# Patient Record
Sex: Female | Born: 1980
Health system: Southern US, Community
[De-identification: ages and names within clinical notes are randomized; demographics above are authoritative.]

## PROBLEM LIST (undated history)

## (undated) DIAGNOSIS — G43909 Migraine, unspecified, not intractable, without status migrainosus: Secondary | ICD-10-CM

## (undated) HISTORY — DX: Migraine, unspecified, not intractable, without status migrainosus: G43.909

## (undated) HISTORY — PX: WISDOM TOOTH EXTRACTION: SHX21

## (undated) HISTORY — PX: NO PAST SURGERIES: SHX2092

---

## 2008-09-16 ENCOUNTER — Ambulatory Visit: Payer: Self-pay | Admitting: Obstetrics and Gynecology

## 2009-07-20 ENCOUNTER — Ambulatory Visit: Payer: Self-pay | Admitting: Obstetrics & Gynecology

## 2010-04-13 ENCOUNTER — Ambulatory Visit: Payer: Self-pay | Admitting: Obstetrics and Gynecology

## 2010-04-13 LAB — CONVERTED CEMR LAB
Trich, Wet Prep: NONE SEEN
Yeast Wet Prep HPF POC: NONE SEEN

## 2010-04-14 ENCOUNTER — Encounter: Payer: Self-pay | Admitting: Obstetrics and Gynecology

## 2010-04-14 LAB — CONVERTED CEMR LAB
Chlamydia, DNA Probe: NEGATIVE
GC Probe Amp, Genital: NEGATIVE

## 2010-05-25 ENCOUNTER — Ambulatory Visit: Payer: Self-pay | Admitting: Obstetrics and Gynecology

## 2010-08-02 ENCOUNTER — Ambulatory Visit: Payer: Self-pay | Admitting: Obstetrics and Gynecology

## 2010-08-03 ENCOUNTER — Encounter (INDEPENDENT_AMBULATORY_CARE_PROVIDER_SITE_OTHER): Payer: Self-pay | Admitting: *Deleted

## 2011-01-09 ENCOUNTER — Other Ambulatory Visit: Payer: Self-pay

## 2011-03-20 NOTE — Assessment & Plan Note (Signed)
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  She was recently on a trip for 2 weeks to  [place] and when she arrived back, she started having some urinary  symptoms over the weekend that appeared as burning when she urinated and  urinary frequency and feeling still needing an urge to go after she  finished urinating.  She was placed for 3 days on Cipro and then 2 days  after the onset of the symptoms, she began having tender swollen lymph  nodes in the inguinal area.  No external lesions were noticed by the  patient nor abnormal discharge.   On examination; her abdomen is soft, flat, nontender with no masses or  organomegaly.  The inguinal areas bilaterally showed enlarged lymph  nodes, somewhat tender to the touch, but certainly not exquisitely  tender, larger on the right side than on the left, but definitely  enlarged.  The external genitalia was normal.  BUS was within normal  limits.  No discharge from Skene glands nor the Bartholin glands.  No  significant abnormal discharge noted in the vagina which was well  rugated.  The cervix is clean, nulliparous.  GC Chlamydia, wet prep, and  viral cultures were taken.  The uterus is anterior, small, easily  mobile, free cul-de-sac and of normal size, shape, consistency with  normal adnexa.   The impression is normal pelvic examination with tender inguinal glands  and dysuria with urinary frequency and a feeling of not emptying.  I am  not sure exactly what is causing this patient's symptoms of any urine  culture and sensitivity just on the rare chance that she has  a bacteria  that was not sensitive to the Cipro.  She was started on some Pyridium  until that result comes back being that urinary tract infections rarely  cause lymphadenopathy and since there is no sign of any active PID or  any other infection, I am not quite sure what the etiology of the  increased lymph nodes are, although more likely than not it is a viral  in etiology and will resolve spontaneously.           ______________________________  Argentina Donovan, MD     PR/MEDQ  D:  04/13/2010  T:  04/13/2010  Job:  [id number]

## 2011-03-20 NOTE — Assessment & Plan Note (Signed)
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  She  is married.  Her husband has had a vasectomy.  She thinks that he has  had an outbreak in the past and she has never had an outbreak since that  time.  Following her visit to [place] last time, she said she had numbers of  the feet and buttocks, but that all has resolved and all her symptoms  have resolved, so we discussed the possibility of HSV suppression and  whether it was worthwhile for her to do.  My feeling is probably even  though her husband probably has had outbreak in the past that it would  be a good idea, so I would suggest that she did, however, I told her  that it is with her and her husband to decide whether or not and she  felt it was worthwhile for them.  She is doing research it further on  the internet and make a decision.  Otherwise, she is asymptomatic.  She  does take oral contraceptives but that is more to control facial acne,  vulgaris and birth control at this point.  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           ______________________________  Argentina Donovan, MD     PR/MEDQ  D:  05/25/2010  T:  05/25/2010  Job:  [id number]

## 2011-03-20 NOTE — Group Therapy Note (Signed)
NAMEDENISS, WORMLEY NO.:  [id number]   MEDICAL RECORD NO.:  [id number]          PATIENT TYPE:  WOC   LOCATION[facility]  [facility]                   FACILITY:  [facility]   PHYSICIAN:  Argentina Donovan, MD        DATE OF BIRTH:  1981/07/20   DATE OF SERVICE:  09/16/2008                                  CLINIC NOTE   REASON FOR VISIT:  [name] is as 30 year old female who  presents for evaluation of low back pain after falling from  approximately 2-1/2 feet yesterday.  She states she was on a ladder  painting and slipped off the ladder, fell and landed directly on her  buttocks on a can of paint.  She relates pain, most specifically to the  tail bone area.  However, she also relates some pain with flexion and  going from a seated to a standing position.  She denies any loss of  bowel or bladder function.  She denies any loss of strength of her lower  extremities.  She denies any decreased sensation in her lower  extremities.   PAST MEDICAL HISTORY:  None.   PAST SURGICAL HISTORY:  None.   No known drug allergies.   MEDICATIONS:  Are oral contraceptives.   SOCIAL HISTORY:  She is an occasional smoker and an occasional alcoholic  beverage.   REVIEW OF SYSTEMS:  Is as per history of present illness.   PHYSICAL EXAM:  Her blood pressure today is 140/77, heart rate is 73,  temperature is 98.2 weight is 128.7 pounds.  On physical exam she has a normal gait and posture.  She has a decreased  range of motion with active forward flexion.  Extension of the low  lumbar spine is normal.  She has tenderness to palpation on both SI  joints with the right greater than left.  However, she is most markedly  tender to palpation over the sacrum at the coccyx.  She has normal 5/5  strength of her quadriceps, hamstrings, hip flexors as well as dorsi and  plantar flexion of both feet.  She has normal sensation of both lower  extremities and her DTRs are 2/4 and equal bilaterally.   Straight leg  raise on the left side produced painful SI joint.  There is no radiation  down the leg with a straight leg raise.  She has negative straight leg  raise on her right side.   ASSESSMENT/PLAN:  1. Acute lumbar strain.  2. Probable coccyx fracture.  Most likely the patient's back pain and      tenderness are related to acute lumbar strain secondary to her      fall.  Given her mechanism of injury as well as the amount of pain      she has in the area of the coccyx, this is highly suspicious for a      coccyx fracture.  I had a lengthy discussion with the patient      regarding the fact that there is not much intervention that is      helpful for a coccyx fracture  other than tincture of time and      symptomatic treatment.  An x-ray was ordered of her lumbar sacral      spine as well as coccyx.  The patient relates that she has      ibuprofen at home.  I offered her a muscle relaxant.  She declined.      I recommended that she use a heating pad or warm bath and showers      for symptomatic relief as well in addition to some mild stretching      as well as some mild physical activities as tolerated.  She will      follow up as needed or if her back pain is not improving over the      next 4-6 weeks.     ______________________________  Jeanne Lynch, D.O.    ______________________________  Argentina Donovan, MD    MC/MEDQ  D:  09/16/2008  T:  09/16/2008  Job:  [id number]

## 2011-11-16 ENCOUNTER — Other Ambulatory Visit: Payer: Self-pay | Admitting: Obstetrics & Gynecology

## 2011-11-16 MED ORDER — NORGESTIMATE-ETH ESTRADIOL 0.25-35 MG-MCG PO TABS: 1.0000 | ORAL_TABLET | Freq: Every day | ORAL | Status: DC

## 2012-03-07 ENCOUNTER — Other Ambulatory Visit: Payer: Self-pay | Admitting: Obstetrics & Gynecology

## 2012-03-07 DIAGNOSIS — N63 Unspecified lump in unspecified breast: Secondary | ICD-10-CM

## 2012-03-14 ENCOUNTER — Ambulatory Visit
Admission: RE | Admit: 2012-03-14 | Discharge: 2012-03-14 | Disposition: A | Discharge status: Home / Self Care | Payer: 59 | Source: Ambulatory Visit | Attending: Obstetrics & Gynecology | Admitting: Obstetrics & Gynecology

## 2012-03-14 ENCOUNTER — Other Ambulatory Visit: Payer: Self-pay | Admitting: Obstetrics & Gynecology

## 2012-03-14 DIAGNOSIS — N63 Unspecified lump in unspecified breast: Secondary | ICD-10-CM

## 2014-09-20 ENCOUNTER — Ambulatory Visit (INDEPENDENT_AMBULATORY_CARE_PROVIDER_SITE_OTHER): Payer: 59 | Admitting: General Practice

## 2014-09-20 DIAGNOSIS — Z23 Encounter for immunization: Secondary | ICD-10-CM

## 2015-11-07 ENCOUNTER — Other Ambulatory Visit: Payer: Self-pay | Admitting: Obstetrics & Gynecology

## 2015-11-07 MED ORDER — AMOXICILLIN 500 MG PO CAPS: 500.0000 mg | ORAL_CAPSULE | Freq: Three times a day (TID) | ORAL | Status: DC

## 2015-11-07 NOTE — Progress Notes (Signed)
Sinus pressure and congestion for 5 weeks not getting better with OTC remedies and saline irrigation.    Amoxicillin 500 tid for 10 days

## 2015-11-09 ENCOUNTER — Other Ambulatory Visit: Payer: Self-pay | Admitting: Obstetrics & Gynecology

## 2015-11-09 MED ORDER — AMOXICILLIN-POT CLAVULANATE 875-125 MG PO TABS: 1.0000 | ORAL_TABLET | Freq: Two times a day (BID) | ORAL | Status: DC

## 2015-11-09 NOTE — Progress Notes (Signed)
Not responding to amox.  Will change to augmentin  +FH 7 weeks 4 days

## 2015-11-19 ENCOUNTER — Other Ambulatory Visit: Payer: Self-pay | Admitting: Obstetrics & Gynecology

## 2015-11-19 MED ORDER — BUDESONIDE 32 MCG/ACT NA SUSP: 1.0000 | Freq: Every day | NASAL | Status: DC

## 2015-11-21 ENCOUNTER — Ambulatory Visit (INDEPENDENT_AMBULATORY_CARE_PROVIDER_SITE_OTHER): Payer: Commercial Managed Care - HMO | Admitting: Obstetrics and Gynecology

## 2015-11-21 ENCOUNTER — Encounter: Payer: Self-pay | Admitting: Obstetrics and Gynecology

## 2015-11-21 VITALS — BP 116/66 | HR 68 | Temp 98.5°F | Ht 70.0 in | Wt 150.6 lb

## 2015-11-21 DIAGNOSIS — J019 Acute sinusitis, unspecified: Secondary | ICD-10-CM | POA: Insufficient documentation

## 2015-11-21 NOTE — Progress Notes (Signed)
CLINIC ENCOUNTER NOTE  History:  35 y.o. G1P0 @ approximately 9.5 weeks here for sick visit.   Symptoms began 5-6 weeks ago. Initially sneezing and sinus pressure and stuffy nose. Getting progressively worse. No chest pain or trouble breathing. Bilateral paranasal discomfort. No nasal discharge. No fevers or chills. Tried amoxicillin, then augmentin. Has used nasal saline. And an OTC decongestant. About to start an intranasal steroid. Rash with sudafed. Never happened before. Does have history sinus infection with pus drainage. Hx seasonal allergies. Started augment, stopped, and now taking for one week. Nose itches.  Past Medical History  Diagnosis Date  . Migraines     History reviewed. No pertinent past surgical history.  The following portions of the patient's history were reviewed and updated as appropriate: allergies, current medications, past family history, past medical history, past social history, past surgical history and problem list.   Review of Systems:  See above; comprehensive review of systems was otherwise negative.  Objective:  Physical Exam BP 116/66 mmHg  Pulse 68  Temp(Src) 98.5 F (36.9 C)  Ht 5\' 10"  (1.778 m)  Wt 150 lb 9.6 oz (68.312 kg)  BMI 21.61 kg/m2 CONSTITUTIONAL: Well-developed, well-nourished female in no acute distress.  HENT:  Normocephalic, atraumatic. Mild ttp paranasally. No rhinorrhea. SKIN: Skin is warm and dry.  NEUROLGIC: Alert  PSYCHIATRIC: Normal mood and affect.  CARDIOVASCULAR: Normal heart rate noted RESPIRATORY: Effort and breath sounds normal, no problems with respiration noted   Labs and Imaging No results found.  Assessment & Plan:   # Acute rhinosinusitis - Given duration of symptoms agree with decision to trial augmentin; unfortunately does not appear to be working, but have told patient she should finish the prescription - non-toxic appearing: no fever, no significant pain, no purulent discharge - given well appearance  think prudent to treat empirically for allergic etiology, as patient's symptoms support this possible diagnosis (duration of symptoms, sneezing, itching, absence of constitutional symptoms). Thus agree with starting intranasal corticosteroid - if no improvement or clinical worsening over the next 1-2 weeks, would pursue head CT to evaluate for anatomic blockage, which sometimes requires surgery  Routine preventative health maintenance measures emphasized.     Moody Robben B. Kya Mayfield, MD OB/GYN Fellow Center [facility], [facility]

## 2015-11-25 ENCOUNTER — Encounter: Payer: Self-pay | Admitting: *Deleted

## 2015-11-25 ENCOUNTER — Ambulatory Visit (INDEPENDENT_AMBULATORY_CARE_PROVIDER_SITE_OTHER): Payer: Commercial Managed Care - HMO | Admitting: Family

## 2015-11-25 ENCOUNTER — Encounter: Payer: Self-pay | Admitting: Family

## 2015-11-25 VITALS — BP 127/79 | HR 75 | Wt 151.0 lb

## 2015-11-25 DIAGNOSIS — Z113 Encounter for screening for infections with a predominantly sexual mode of transmission: Secondary | ICD-10-CM | POA: Diagnosis not present

## 2015-11-25 DIAGNOSIS — Z1151 Encounter for screening for human papillomavirus (HPV): Secondary | ICD-10-CM

## 2015-11-25 DIAGNOSIS — Z3490 Encounter for supervision of normal pregnancy, unspecified, unspecified trimester: Secondary | ICD-10-CM | POA: Insufficient documentation

## 2015-11-25 DIAGNOSIS — Z3491 Encounter for supervision of normal pregnancy, unspecified, first trimester: Secondary | ICD-10-CM

## 2015-11-25 DIAGNOSIS — Z3401 Encounter for supervision of normal first pregnancy, first trimester: Secondary | ICD-10-CM

## 2015-11-25 DIAGNOSIS — Z3481 Encounter for supervision of other normal pregnancy, first trimester: Secondary | ICD-10-CM

## 2015-11-25 DIAGNOSIS — Z124 Encounter for screening for malignant neoplasm of cervix: Secondary | ICD-10-CM

## 2015-11-25 NOTE — Progress Notes (Signed)
      Subjective:    Jeanne Lynch is a G1P0 [redacted]w[redacted]d being seen today for her first obstetrical visit.  This is patient's first pregnancy.  Medical history insignificant for problems that may contribute to pregnancy.  Patient does intend to breast feed. Pregnancy history fully reviewed.  Patient reports no complaints.  Reports feeling "great"!  Filed Vitals:   11/25/15 1002  BP: 127/79  Pulse: 75  Weight: 151 lb (68.493 kg)    HISTORY: OB History  Gravida Para Term Preterm AB SAB TAB Ectopic Multiple Living  1             # Outcome Date GA Lbr Len/2nd Weight Sex Delivery Anes PTL Lv  1 Current              Past Medical History  Diagnosis Date  . Migraines    No past surgical history on file. History reviewed. No pertinent family history.   Exam    BP 127/79 mmHg  Pulse 75  Wt 151 lb (68.493 kg)  LMP 09/12/2015 (Approximate) Uterine Size: size equals dates  Pelvic Exam:    Perineum: No Hemorrhoids, Normal Perineum   Vulva: normal   Vagina:  normal mucosa, normal discharge, no palpable nodules   pH: Not done   Cervix: no bleeding following Pap, no cervical motion tenderness and no lesions   Adnexa: normal adnexa and no mass, fullness, tenderness   Bony Pelvis: Adequate  System: Breast:  No nipple retraction or dimpling, No nipple discharge or bleeding, No axillary or supraclavicular adenopathy, Normal to palpation without dominant masses   Skin: normal coloration and turgor, no rashes    Neurologic: negative   Extremities: normal strength, tone, and muscle mass   HEENT neck supple with midline trachea and thyroid without masses   Mouth/Teeth mucous membranes moist, pharynx normal without lesions   Neck supple and no masses   Cardiovascular: regular rate and rhythm, no murmurs or gallops   Respiratory:  appears well, vitals normal, no respiratory distress, acyanotic, normal RR, neck free of mass or lymphadenopathy, chest clear, no wheezing, crepitations,  rhonchi, normal symmetric air entry   Abdomen: soft, non-tender; bowel sounds normal; no masses,  no organomegaly   Urinary: urethral meatus normal       Assessment:    Pregnancy: G1P0 Patient Active Problem List   Diagnosis Date Noted  . Supervision of normal pregnancy, antepartum 11/25/2015  . Acute rhinosinusitis 11/21/2015        Plan:     Initial labs drawn.  Pap smear collected. Prenatal vitamins. Problem list reviewed and updated. Genetic Screening discussed First Screen: ordered.  Considering NIPS, checking with insurance regarding out-of-pocket cost.  Follow up in 4 weeks.  Desires to sign up for BabyScripts.  Marlis Edelson 11/25/2015

## 2015-11-25 NOTE — Progress Notes (Signed)
Bedside U/S shows IUP with FHT of 182 bpm and CRL 31.54mm  GA is 10w

## 2015-11-26 LAB — CULTURE, URINE COMPREHENSIVE
Colony Count: NO GROWTH
Organism ID, Bacteria: NO GROWTH

## 2015-11-26 LAB — HIV ANTIBODY (ROUTINE TESTING W REFLEX): HIV: NONREACTIVE

## 2015-11-28 LAB — OBSTETRIC PANEL
Antibody Screen: NEGATIVE
BASOS ABS: 0 10*3/uL (ref 0.0–0.1)
Basophils Relative: 0 % (ref 0–1)
Eosinophils Absolute: 0.3 10*3/uL (ref 0.0–0.7)
Eosinophils Relative: 4 % (ref 0–5)
HCT: 39.1 % (ref 36.0–46.0)
Hemoglobin: 13.2 g/dL (ref 12.0–15.0)
Hepatitis B Surface Ag: NEGATIVE
LYMPHS ABS: 1.8 10*3/uL (ref 0.7–4.0)
LYMPHS PCT: 22 % (ref 12–46)
MCH: 29.5 pg (ref 26.0–34.0)
MCHC: 33.8 g/dL (ref 30.0–36.0)
MCV: 87.3 fL (ref 78.0–100.0)
MONOS PCT: 5 % (ref 3–12)
MPV: 10.2 fL (ref 8.6–12.4)
Monocytes Absolute: 0.4 10*3/uL (ref 0.1–1.0)
NEUTROS PCT: 69 % (ref 43–77)
Neutro Abs: 5.6 10*3/uL (ref 1.7–7.7)
PLATELETS: 249 10*3/uL (ref 150–400)
RBC: 4.48 MIL/uL (ref 3.87–5.11)
RDW: 13 % (ref 11.5–15.5)
RUBELLA: 7.83 {index} — AB (ref <0.90)
Rh Type: POSITIVE
WBC: 8.1 10*3/uL (ref 4.0–10.5)

## 2015-11-28 LAB — CYTOLOGY - PAP

## 2015-12-09 ENCOUNTER — Ambulatory Visit (INDEPENDENT_AMBULATORY_CARE_PROVIDER_SITE_OTHER): Payer: Commercial Managed Care - HMO | Admitting: Family

## 2015-12-09 VITALS — BP 127/79 | HR 90 | Wt 150.0 lb

## 2015-12-09 DIAGNOSIS — Z23 Encounter for immunization: Secondary | ICD-10-CM

## 2015-12-09 DIAGNOSIS — O98319 Other infections with a predominantly sexual mode of transmission complicating pregnancy, unspecified trimester: Secondary | ICD-10-CM

## 2015-12-09 DIAGNOSIS — A6009 Herpesviral infection of other urogenital tract: Secondary | ICD-10-CM | POA: Insufficient documentation

## 2015-12-09 DIAGNOSIS — A609 Anogenital herpesviral infection, unspecified: Secondary | ICD-10-CM

## 2015-12-09 DIAGNOSIS — Z3401 Encounter for supervision of normal first pregnancy, first trimester: Secondary | ICD-10-CM

## 2015-12-09 DIAGNOSIS — Z3481 Encounter for supervision of other normal pregnancy, first trimester: Secondary | ICD-10-CM

## 2015-12-09 DIAGNOSIS — O98519 Other viral diseases complicating pregnancy, unspecified trimester: Secondary | ICD-10-CM

## 2015-12-09 MED ORDER — ACYCLOVIR 400 MG PO TABS: ORAL_TABLET | ORAL | Status: DC

## 2015-12-09 NOTE — Progress Notes (Signed)
Subjective:  Jeanne Lynch is a 35 y.o. G1P0 at [redacted]w[redacted]d being seen today for ongoing prenatal care.  She is currently monitored for the following issues for this low-risk pregnancy and has Acute rhinosinusitis; Supervision of normal pregnancy, antepartum; and Genital herpes affecting pregnancy on her problem list.  Patient reports no complaints.  Desires acyclovir for potential HSV outbreak  .  Marland Kitchen  Movement: Absent. Denies leaking of fluid.   The following portions of the patient's history were reviewed and updated as appropriate: allergies, current medications, past family history, past medical history, past social history, past surgical history and problem list. Problem list updated.  Objective:   Filed Vitals:   12/09/15 1028  BP: 127/79  Pulse: 90  Weight: 150 lb (68.04 kg)    Fetal Status:     Movement: Absent     General:  Alert, oriented and cooperative. Patient is in no acute distress.  Skin: Skin is warm and dry. No rash noted.   Cardiovascular: Normal heart rate noted  Respiratory: Normal respiratory effort, no problems with respiration noted  Abdomen: Soft, gravid, appropriate for gestational age. Pain/Pressure: Absent     Pelvic:       Cervical exam deferred        Extremities: Normal range of motion.  Edema: None  Mental Status: Normal mood and affect. Normal behavior. Normal judgment and thought content.   Urinalysis:    Protein negative Glucose negative  Assessment and Plan:  Pregnancy: G1P0 at [redacted]w[redacted]d  1. Supervision of normal pregnancy, antepartum, first trimester - [place] MFM OB COMP + 14 WK; Future - Discussed when movement is typically felt 18-20 wks  2. Genital herpes affecting pregnancy - Given RX for acyclovir - Explained will need treatment in 3rd trimester  General obstetric precautions including but not limited to vaginal bleeding and pelvic pain reviewed in detail with the patient. Please refer to After Visit Summary for other counseling recommendations.   Return for BabyScripts.   Eino Farber Kennith Gain, CNM

## 2015-12-15 ENCOUNTER — Ambulatory Visit (HOSPITAL_COMMUNITY): Payer: Commercial Managed Care - HMO

## 2015-12-15 ENCOUNTER — Other Ambulatory Visit (HOSPITAL_COMMUNITY): Payer: Commercial Managed Care - HMO

## 2015-12-19 ENCOUNTER — Encounter: Payer: Self-pay | Admitting: *Deleted

## 2015-12-19 DIAGNOSIS — Z3482 Encounter for supervision of other normal pregnancy, second trimester: Secondary | ICD-10-CM

## 2016-01-27 ENCOUNTER — Ambulatory Visit (HOSPITAL_COMMUNITY)
Admission: RE | Admit: 2016-01-27 | Discharge: 2016-01-27 | Disposition: A | Discharge status: Home / Self Care | Payer: Commercial Managed Care - HMO | Source: Ambulatory Visit | Attending: Family | Admitting: Family

## 2016-01-27 DIAGNOSIS — Z36 Encounter for antenatal screening of mother: Secondary | ICD-10-CM | POA: Insufficient documentation

## 2016-01-27 DIAGNOSIS — Z3A18 18 weeks gestation of pregnancy: Secondary | ICD-10-CM | POA: Insufficient documentation

## 2016-01-27 DIAGNOSIS — O4440 Low lying placenta NOS or without hemorrhage, unspecified trimester: Secondary | ICD-10-CM

## 2016-01-27 DIAGNOSIS — Z3481 Encounter for supervision of other normal pregnancy, first trimester: Secondary | ICD-10-CM

## 2016-01-30 DIAGNOSIS — O4440 Low lying placenta NOS or without hemorrhage, unspecified trimester: Secondary | ICD-10-CM | POA: Insufficient documentation

## 2016-02-03 ENCOUNTER — Ambulatory Visit (INDEPENDENT_AMBULATORY_CARE_PROVIDER_SITE_OTHER): Payer: Commercial Managed Care - HMO | Admitting: Family

## 2016-02-03 VITALS — BP 111/70 | HR 74 | Wt 151.0 lb

## 2016-02-03 DIAGNOSIS — Z3482 Encounter for supervision of other normal pregnancy, second trimester: Secondary | ICD-10-CM

## 2016-02-03 DIAGNOSIS — O4440 Low lying placenta NOS or without hemorrhage, unspecified trimester: Secondary | ICD-10-CM

## 2016-02-03 NOTE — Progress Notes (Signed)
Subjective:  Jeanne Lynch is a 35 y.o. G1P0 at 6572w6d being seen today for ongoing prenatal care.  She is currently monitored for the following issues for this low-risk pregnancy and has Acute rhinosinusitis; Supervision of normal pregnancy, antepartum; Genital herpes affecting pregnancy; and Low lying placenta, antepartum on her problem list.  Patient reports no complaints.   . Vag. Bleeding: None.  Movement: Absent. Denies leaking of fluid.   The following portions of the patient's history were reviewed and updated as appropriate: allergies, current medications, past family history, past medical history, past social history, past surgical history and problem list. Problem list updated.  Objective:   Filed Vitals:   02/03/16 1018  BP: 111/70  Pulse: 74  Weight: 151 lb (68.493 kg)    Fetal Status: Fetal Heart Rate (bpm): 136 Fundal Height: 20 cm Movement: Absent     General:  Alert, oriented and cooperative. Patient is in no acute distress.  Skin: Skin is warm and dry. No rash noted.   Cardiovascular: Normal heart rate noted  Respiratory: Normal respiratory effort, no problems with respiration noted  Abdomen: Soft, gravid, appropriate for gestational age. Pain/Pressure: Absent     Pelvic: Vag. Bleeding: None Vag D/C Character: Thin   Cervical exam deferred        Extremities: Normal range of motion.  Edema: None  Mental Status: Normal mood and affect. Normal behavior. Normal judgment and thought content.   Urinalysis: Urine Protein: Negative Urine Glucose: Trace  Assessment and Plan:  Pregnancy: G1P0 at 7672w6d  1. Supervision of normal pregnancy, antepartum, second trimester - Reviewed ultrasound results - nml.    2. Low lying placenta, antepartum - Continue bleeding precautions  General obstetric precautions including but not limited to vaginal bleeding and pelvic pain reviewed in detail with the patient. Please refer to After Visit Summary for other counseling  recommendations.  Return for BabyScripts.   Eino FarberWalidah Kennith GainN Karim, CNM

## 2016-03-09 ENCOUNTER — Other Ambulatory Visit: Payer: Self-pay | Admitting: Obstetrics & Gynecology

## 2016-03-09 DIAGNOSIS — O4440 Low lying placenta NOS or without hemorrhage, unspecified trimester: Secondary | ICD-10-CM

## 2016-03-30 ENCOUNTER — Ambulatory Visit (INDEPENDENT_AMBULATORY_CARE_PROVIDER_SITE_OTHER): Payer: Commercial Managed Care - HMO | Admitting: Certified Nurse Midwife

## 2016-03-30 VITALS — BP 117/66 | HR 78 | Wt 159.0 lb

## 2016-03-30 DIAGNOSIS — Z23 Encounter for immunization: Secondary | ICD-10-CM | POA: Diagnosis not present

## 2016-03-30 DIAGNOSIS — Z3482 Encounter for supervision of other normal pregnancy, second trimester: Secondary | ICD-10-CM

## 2016-03-30 DIAGNOSIS — J302 Other seasonal allergic rhinitis: Secondary | ICD-10-CM

## 2016-03-30 DIAGNOSIS — O4440 Low lying placenta NOS or without hemorrhage, unspecified trimester: Secondary | ICD-10-CM

## 2016-03-30 DIAGNOSIS — Z3492 Encounter for supervision of normal pregnancy, unspecified, second trimester: Secondary | ICD-10-CM

## 2016-03-30 LAB — CBC
HCT: 34.4 % — ABNORMAL LOW (ref 35.0–45.0)
Hemoglobin: 11.4 g/dL — ABNORMAL LOW (ref 11.7–15.5)
MCH: 30.6 pg (ref 27.0–33.0)
MCHC: 33.1 g/dL (ref 32.0–36.0)
MCV: 92.2 fL (ref 80.0–100.0)
MPV: 9.6 fL (ref 7.5–12.5)
Platelets: 216 10*3/uL (ref 140–400)
RBC: 3.73 MIL/uL — ABNORMAL LOW (ref 3.80–5.10)
RDW: 12.9 % (ref 11.0–15.0)
WBC: 8.5 10*3/uL (ref 3.8–10.8)

## 2016-03-30 MED ORDER — BUDESONIDE 32 MCG/ACT NA SUSP: 1.0000 | Freq: Every day | NASAL | Status: DC

## 2016-03-30 NOTE — Patient Instructions (Signed)
Td Vaccine (Tetanus and Diphtheria): What You Need to Know  1. Why get vaccinated?  Tetanus  and diphtheria are very serious diseases. They are rare in the [place] today, but people who do become infected often have severe complications. Td vaccine is used to protect adolescents and adults from both of these diseases.  Both tetanus and diphtheria are infections caused by bacteria. Diphtheria spreads from person to person through coughing or sneezing. Tetanus-causing bacteria enter the body through cuts, scratches, or wounds.  TETANUS (Lockjaw) causes painful muscle tightening and stiffness, usually all over the body.  · It can lead to tightening of muscles in the head and neck so you can't open your mouth, swallow, or sometimes even breathe. Tetanus kills about 1 out of every 10 people who are infected even after receiving the best medical care.  DIPHTHERIA can cause a thick coating to form in the back of the throat.  · It can lead to breathing problems, paralysis, heart failure, and death.  Before vaccines, as many as 200,000 cases of diphtheria and hundreds of cases of tetanus were reported in the [place] each year. Since vaccination began, reports of cases for both diseases have dropped by about 99%.  2. Td vaccine  Td vaccine can protect adolescents and adults from tetanus and diphtheria. Td is usually given as a booster dose every 10 years but it can also be given earlier after a severe and dirty wound or burn.  Another vaccine, called Tdap, which protects against pertussis in addition to tetanus and diphtheria, is sometimes recommended instead of Td vaccine.  Your doctor or the person giving you the vaccine can give you more information.  Td may safely be given at the same time as other vaccines.  3. Some people should not get this vaccine  · A person who has ever had a life-threatening allergic reaction after a previous dose of any tetanus or diphtheria containing vaccine, OR has a severe allergy  to any part of this vaccine, should not get Td vaccine. Tell the person giving the vaccine about any severe allergies.  · Talk to your doctor if you:    have seizures or another nervous system problem,    had severe pain or swelling after any vaccine containing diphtheria or tetanus,    ever had a condition called Guillain Barre Syndrome (GBS),    aren't feeling well on the day the shot is scheduled.  4. Risks of a vaccine reaction  With any medicine, including vaccines, there is a chance of side effects. These are usually mild and go away on their own. Serious reactions are also possible but are rare.  Most people who get Td vaccine do not have any problems with it.  Mild Problems  following Td vaccine:  (Did not interfere with activities)  · Pain where the shot was given (about 8 people in 10)  · Redness or swelling where the shot was given (about 1 person in 4)  · Mild fever (rare)  · Headache (about 1 person in 4)  · Tiredness (about 1 person in 4)  Moderate Problems following Td vaccine:  (Interfered with activities, but did not require medical attention)  · Fever over 102°F (rare)  Severe Problems  following Td vaccine:  (Unable to perform usual activities; required medical attention)  · Swelling, severe pain, bleeding and/or redness in the arm where the shot was given (rare).  Problems that could happen after any vaccine:  · People sometimes   faint after a medical procedure, including vaccination. Sitting or lying down for about 15 minutes can help prevent fainting, and injuries caused by a fall. Tell your doctor if you feel dizzy, or have vision changes or ringing in the ears.  · Some people get severe pain in the shoulder and have difficulty moving the arm where a shot was given. This happens very rarely.  · Any medication can cause a severe allergic reaction. Such reactions from a vaccine are very rare, estimated at fewer than 1 in a million doses, and would happen within a few minutes to a few hours after  the vaccination.  As with any medicine, there is a very remote chance of a vaccine causing a serious injury or death.  The safety of vaccines is always being monitored. For more information, visit: www.cdc.gov/vaccinesafety/  5. What if there is a serious reaction?  What should I look for?  · Look for anything that concerns you, such as signs of a severe allergic reaction, very high fever, or unusual behavior.  Signs of a severe allergic reaction can include hives, swelling of the face and throat, difficulty breathing, a fast heartbeat, dizziness, and weakness. These would usually start a few minutes to a few hours after the vaccination.  What should I do?  · If you think it is a severe allergic reaction or other emergency that can't wait, call 9-1-1 or get the person to the nearest hospital. Otherwise, call your doctor.  · Afterward, the reaction should be reported to the Vaccine Adverse Event Reporting System (VAERS). Your doctor might file this report, or you can do it yourself through the VAERS web site at www.vaers.hhs.gov, or by calling 1-800-822-7967.  VAERS does not give medical advice.  6. The National Vaccine Injury Compensation Program  The National Vaccine Injury Compensation Program (VICP) is a federal program that was created to compensate people who may have been injured by certain vaccines.  Persons who believe they may have been injured by a vaccine can learn about the program and about filing a claim by calling 1-800-338-2382 or visiting the VICP website at www.hrsa.gov/vaccinecompensation. There is a time limit to file a claim for compensation.  7. How can I learn more?  · Ask your doctor. He or she can give you the vaccine package insert or suggest other sources of information.  · Call your local or state health department.  · Contact the Centers for Disease Control and Prevention (CDC):    Call 1-800-232-4636 (1-800-CDC-INFO)    Visit CDC's website at www.cdc.gov/vaccines  CDC Td Vaccine VIS  (12/29/13)     This information is not intended to replace advice given to you by your health care provider. Make sure you discuss any questions you have with your health care provider.     Document Released: 08/19/2006 Document Revised: 11/12/2014 Document Reviewed: 02/03/2014  Elsevier Interactive Patient Education ©2016 [employer]

## 2016-03-31 LAB — GLUCOSE TOLERANCE, 1 HOUR (50G) W/O FASTING: Glucose, 1 Hr, gestational: 94 mg/dL (ref <140)

## 2016-03-31 LAB — HIV ANTIBODY (ROUTINE TESTING W REFLEX): HIV 1&2 Ab, 4th Generation: NONREACTIVE

## 2016-03-31 LAB — RPR

## 2016-04-03 ENCOUNTER — Ambulatory Visit (HOSPITAL_COMMUNITY)
Admission: RE | Admit: 2016-04-03 | Discharge: 2016-04-03 | Disposition: A | Discharge status: Home / Self Care | Payer: Commercial Managed Care - HMO | Source: Ambulatory Visit | Attending: Obstetrics & Gynecology | Admitting: Obstetrics & Gynecology

## 2016-04-03 ENCOUNTER — Other Ambulatory Visit: Payer: Self-pay | Admitting: Obstetrics & Gynecology

## 2016-04-03 ENCOUNTER — Telehealth: Payer: Self-pay | Admitting: *Deleted

## 2016-04-03 DIAGNOSIS — Z3A28 28 weeks gestation of pregnancy: Secondary | ICD-10-CM | POA: Insufficient documentation

## 2016-04-03 DIAGNOSIS — A6009 Herpesviral infection of other urogenital tract: Secondary | ICD-10-CM

## 2016-04-03 DIAGNOSIS — O4440 Low lying placenta NOS or without hemorrhage, unspecified trimester: Secondary | ICD-10-CM

## 2016-04-03 DIAGNOSIS — O98313 Other infections with a predominantly sexual mode of transmission complicating pregnancy, third trimester: Secondary | ICD-10-CM

## 2016-04-03 DIAGNOSIS — O4443 Low lying placenta NOS or without hemorrhage, third trimester: Secondary | ICD-10-CM | POA: Insufficient documentation

## 2016-04-03 DIAGNOSIS — Z3482 Encounter for supervision of other normal pregnancy, second trimester: Secondary | ICD-10-CM

## 2016-04-03 NOTE — Telephone Encounter (Signed)
Pt notified of normal 28 week labs 

## 2016-04-27 ENCOUNTER — Ambulatory Visit (INDEPENDENT_AMBULATORY_CARE_PROVIDER_SITE_OTHER): Payer: Commercial Managed Care - HMO | Admitting: Family

## 2016-04-27 ENCOUNTER — Encounter: Payer: Self-pay | Admitting: Family

## 2016-04-27 VITALS — BP 122/75 | HR 75 | Wt 162.0 lb

## 2016-04-27 DIAGNOSIS — L989 Disorder of the skin and subcutaneous tissue, unspecified: Secondary | ICD-10-CM

## 2016-04-27 DIAGNOSIS — Z3483 Encounter for supervision of other normal pregnancy, third trimester: Secondary | ICD-10-CM

## 2016-04-27 DIAGNOSIS — O4440 Low lying placenta NOS or without hemorrhage, unspecified trimester: Secondary | ICD-10-CM

## 2016-04-27 NOTE — Progress Notes (Signed)
Subjective:  Jeanne Lynch is a 35 y.o. G1P0 at 235w6d being seen today for ongoing prenatal care.  She is currently monitored for the following issues for this low-risk pregnancy and has Acute rhinosinusitis; Supervision of normal pregnancy, antepartum; Genital herpes affecting pregnancy; and Low lying placenta, antepartum on her problem list.  Patient reports reports doubling in size of two moles.  One is located on lower abdomen and the second is on her right breast..  Contractions: Irregular. Vag. Bleeding: None.  Movement: Absent. Denies leaking of fluid.   The following portions of the patient's history were reviewed and updated as appropriate: allergies, current medications, past family history, past medical history, past social history, past surgical history and problem list. Problem list updated.  Objective:   Filed Vitals:   04/27/16 0847  BP: 122/75  Pulse: 75  Weight: 162 lb (73.483 kg)    Fetal Status: Fetal Heart Rate (bpm): 137   Movement: Absent     General:  Alert, oriented and cooperative. Patient is in no acute distress.  Skin: Skin is warm and dry. No rash noted; mole on right breast with irregular border and rough appearance; mole on lower abdomen with rough, fleshy appearance.   Cardiovascular: Normal heart rate noted  Respiratory: Normal respiratory effort, no problems with respiration noted  Abdomen: Soft, gravid, appropriate for gestational age. Pain/Pressure: Absent     Pelvic: Cervical exam deferred        Extremities: Normal range of motion.  Edema: None  Mental Status: Normal mood and affect. Normal behavior. Normal judgment and thought content.   Urinalysis: Urine Protein: Negative Urine Glucose: Negative  Assessment and Plan:  Pregnancy: G1P0 at 9835w6d  1. Low lying placenta, antepartum - 1 cm from os; rescan at 36 wks (currently scheduled)  2. Supervision of normal pregnancy, antepartum, third trimester - Reviewed contraception methods >  considering Mirena - Desires to have a code word for when she wants mother or mother-in-law out of the room during labor; will decide before labor - Plans to donate cord blood > reviewed process in hospital  3.  Abnormal Mole - Refer to dermatologist; plans to call and make an appt  Preterm labor symptoms and general obstetric precautions including but not limited to vaginal bleeding, contractions, leaking of fluid and fetal movement were reviewed in detail with the patient. Please refer to After Visit Summary for other counseling recommendations.  Return for [facility].   Eino FarberWalidah Kennith GainN Karim, CNM

## 2016-04-27 NOTE — Progress Notes (Signed)
Going to be on [facility] in Jan about birthing experience

## 2016-05-18 ENCOUNTER — Ambulatory Visit (INDEPENDENT_AMBULATORY_CARE_PROVIDER_SITE_OTHER): Payer: Commercial Managed Care - HMO | Admitting: Advanced Practice Midwife

## 2016-05-18 ENCOUNTER — Encounter: Payer: Self-pay | Admitting: Advanced Practice Midwife

## 2016-05-18 VITALS — BP 126/77 | HR 82 | Wt 165.0 lb

## 2016-05-18 DIAGNOSIS — Z3490 Encounter for supervision of normal pregnancy, unspecified, unspecified trimester: Secondary | ICD-10-CM

## 2016-05-18 DIAGNOSIS — Z3403 Encounter for supervision of normal first pregnancy, third trimester: Secondary | ICD-10-CM

## 2016-05-18 NOTE — Progress Notes (Signed)
Subjective:  Jeanne Lynch is a 35 y.o. G1P0 at 10260w6d being seen today for ongoing prenatal care.  She is currently monitored for the following issues for this low-risk pregnancy and has Acute rhinosinusitis; Supervision of normal pregnancy, antepartum; Genital herpes affecting pregnancy; Low lying placenta, antepartum; and Irregular mole on her problem list.  Patient reports no complaints.  Contractions: Not present. Vag. Bleeding: None.  Movement: Present. Denies leaking of fluid.   The following portions of the patient's history were reviewed and updated as appropriate: allergies, current medications, past family history, past medical history, past social history, past surgical history and problem list. Problem list updated.  Objective:   Filed Vitals:   05/18/16 1009  BP: 126/77  Pulse: 82  Weight: 165 lb (74.844 kg)    Fetal Status: Fetal Heart Rate (bpm): 136   Movement: Present     General:  Alert, oriented and cooperative. Patient is in no acute distress.  Skin: Skin is warm and dry. No rash noted.   Cardiovascular: Normal heart rate noted  Respiratory: Normal respiratory effort, no problems with respiration noted  Abdomen: Soft, gravid, appropriate for gestational age. Pain/Pressure: Absent     Pelvic:  Cervical exam deferred        Extremities: Normal range of motion.  Edema: None  Mental Status: Normal mood and affect. Normal behavior. Normal judgment and thought content.   Urinalysis: Urine Protein: Negative Urine Glucose: Negative  Assessment and Plan:  Pregnancy: G1P0 at 7160w6d  1. Prenatal care, unspecified trimester     Discussed signs of labor.  May want her mother and his mother in room, but wants to have us help her communicate if she decides not to have them there     May want nitrous oxide for labor     Plan GBS /cultures next visit  Preterm labor symptoms and general obstetric precautions including but not limited to vaginal bleeding, contractions, leaking  of fluid and fetal movement were reviewed in detail with the patient. Please refer to After Visit Summary for other counseling recommendations.  [place] 2 [place] (around 06[place]01/2016[place] [facility]   Aviva SignsMarie L Sharmane Dame, CNM

## 2016-05-18 NOTE — Patient Instructions (Signed)

## 2016-05-29 ENCOUNTER — Ambulatory Visit (HOSPITAL_COMMUNITY)
Admission: RE | Admit: 2016-05-29 | Discharge: 2016-05-29 | Disposition: A | Discharge status: Home / Self Care | Payer: Commercial Managed Care - HMO | Source: Ambulatory Visit | Attending: Obstetrics & Gynecology | Admitting: Obstetrics & Gynecology

## 2016-05-29 DIAGNOSIS — O4440 Low lying placenta NOS or without hemorrhage, unspecified trimester: Secondary | ICD-10-CM

## 2016-05-29 DIAGNOSIS — O4443 Low lying placenta NOS or without hemorrhage, third trimester: Secondary | ICD-10-CM | POA: Diagnosis present

## 2016-05-29 DIAGNOSIS — Z3A36 36 weeks gestation of pregnancy: Secondary | ICD-10-CM | POA: Insufficient documentation

## 2016-06-01 ENCOUNTER — Ambulatory Visit (INDEPENDENT_AMBULATORY_CARE_PROVIDER_SITE_OTHER): Payer: Commercial Managed Care - HMO | Admitting: Family

## 2016-06-01 VITALS — BP 118/70 | HR 74 | Wt 167.0 lb

## 2016-06-01 DIAGNOSIS — O98319 Other infections with a predominantly sexual mode of transmission complicating pregnancy, unspecified trimester: Secondary | ICD-10-CM

## 2016-06-01 DIAGNOSIS — Z3483 Encounter for supervision of other normal pregnancy, third trimester: Secondary | ICD-10-CM

## 2016-06-01 DIAGNOSIS — O98519 Other viral diseases complicating pregnancy, unspecified trimester: Secondary | ICD-10-CM

## 2016-06-01 DIAGNOSIS — Z36 Encounter for antenatal screening of mother: Secondary | ICD-10-CM | POA: Diagnosis not present

## 2016-06-01 DIAGNOSIS — A609 Anogenital herpesviral infection, unspecified: Secondary | ICD-10-CM

## 2016-06-01 DIAGNOSIS — Z3403 Encounter for supervision of normal first pregnancy, third trimester: Secondary | ICD-10-CM

## 2016-06-01 DIAGNOSIS — Z3493 Encounter for supervision of normal pregnancy, unspecified, third trimester: Secondary | ICD-10-CM

## 2016-06-01 DIAGNOSIS — Z113 Encounter for screening for infections with a predominantly sexual mode of transmission: Secondary | ICD-10-CM

## 2016-06-01 DIAGNOSIS — O4440 Low lying placenta NOS or without hemorrhage, unspecified trimester: Secondary | ICD-10-CM

## 2016-06-01 DIAGNOSIS — A6009 Herpesviral infection of other urogenital tract: Secondary | ICD-10-CM

## 2016-06-01 LAB — OB RESULTS CONSOLE GC/CHLAMYDIA: Gonorrhea: NEGATIVE

## 2016-06-01 LAB — OB RESULTS CONSOLE GBS: STREP GROUP B AG: NEGATIVE

## 2016-06-01 NOTE — Progress Notes (Signed)
Subjective:  Jeanne Lynch is a 35 y.o. G1P0 at [redacted]w[redacted]d being seen today for ongoing prenatal care.  She is currently monitored for the following issues for this low-risk pregnancy and has Acute rhinosinusitis; Supervision of normal pregnancy, antepartum; Genital herpes affecting pregnancy; Low lying placenta, antepartum; and Irregular mole on her problem list.  Patient reports increasing fatigue and "nesting".  Contractions: Not present. Vag. Bleeding: None.  Movement: Present. Denies leaking of fluid.   The following portions of the patient's history were reviewed and updated as appropriate: allergies, current medications, past family history, past medical history, past social history, past surgical history and problem list. Problem list updated.  Objective:   Vitals:   06/01/16 0855  BP: 118/70  Pulse: 74  Weight: 167 lb (75.8 kg)    Fetal Status: Fetal Heart Rate (bpm): 141   Movement: Present     General:  Alert, oriented and cooperative. Patient is in no acute distress.  Skin: Skin is warm and dry. No rash noted.   Cardiovascular: Normal heart rate noted  Respiratory: Normal respiratory effort, no problems with respiration noted  Abdomen: Soft, gravid, appropriate for gestational age. Pain/Pressure: Present     Pelvic:  Cervical exam performed      closed/thick  Extremities: Normal range of motion.  Edema: None  Mental Status: Normal mood and affect. Normal behavior. Normal judgment and thought content.   Urinalysis: Urine Protein: Negative Urine Glucose: Negative  Assessment and Plan:  Pregnancy: G1P0 at [redacted]w[redacted]d  1. Normal pregnancy, third trimester - GBS and GC/CT today - Culture, beta strep (group b only) - Urine cytology ancillary only  2. Low lying placenta, antepartum - Resolved at 36 wk ultrasound  3. Genital herpes affecting pregnancy - Continue meds for prophylaxis  Preterm labor symptoms and general obstetric precautions including but not limited to vaginal  bleeding, contractions, leaking of fluid and fetal movement were reviewed in detail with the patient. Please refer to After Visit Summary for other counseling recommendations.  Return in about 1 week (around 06/08/2016).   Eino Farber Kennith Gain, CNM

## 2016-06-03 LAB — CULTURE, BETA STREP (GROUP B ONLY)

## 2016-06-04 LAB — URINE CYTOLOGY ANCILLARY ONLY
CHLAMYDIA, DNA PROBE: NEGATIVE
NEISSERIA GONORRHEA: NEGATIVE

## 2016-06-08 ENCOUNTER — Ambulatory Visit (INDEPENDENT_AMBULATORY_CARE_PROVIDER_SITE_OTHER): Payer: Commercial Managed Care - HMO | Admitting: Advanced Practice Midwife

## 2016-06-08 VITALS — BP 128/74 | HR 73 | Wt 167.0 lb

## 2016-06-08 DIAGNOSIS — Z3493 Encounter for supervision of normal pregnancy, unspecified, third trimester: Secondary | ICD-10-CM

## 2016-06-08 NOTE — Patient Instructions (Addendum)
Vaginal Delivery °During delivery, your health care provider will help you give birth to your baby. During a vaginal delivery, you will work to push the baby out of your vagina. However, before you can push your baby out, a few things need to happen. The opening of your uterus (cervix) has to soften, thin out, and open up (dilate) all the way to 10 cm. Also, your baby has to move down from the uterus into your vagina.  °SIGNS OF LABOR  °Your health care provider will first need to make sure you are in labor. Signs of labor include:  °· Passing what is called the mucous plug before labor begins. This is a small amount of blood-stained mucus. °· Having regular, painful uterine contractions.   °· The time between contractions gets shorter.   °· The discomfort and pain gradually get more intense. °· Contraction pains get worse when walking and do not go away when resting.   °· Your cervix becomes thinner (effacement) and dilates. °BEFORE THE DELIVERY °Once you are in labor and admitted into the hospital or care center, your health care provider may do the following:  °· Perform a complete physical exam. °· Review any complications related to pregnancy or labor.  °· Check your blood pressure, pulse, temperature, and heart rate (vital signs).   °· Determine if, and when, the rupture of amniotic membranes occurred. °· Do a vaginal exam (using a sterile glove and lubricant) to determine:   °¨ The position (presentation) of the baby. Is the baby's head presenting first (vertex) in the birth canal (vagina), or are the feet or buttocks first (breech)?   °¨ The level (station) of the baby's head within the birth canal.   °¨ The effacement and dilatation of the cervix.   °· An electronic fetal monitor is usually placed on your abdomen when you first arrive. This is used to monitor your contractions and the baby's heart rate. °¨ When the monitor is on your abdomen (external fetal monitor), it can only pick up the frequency and  length of your contractions. It cannot tell the strength of your contractions. °¨ If it becomes necessary for your health care provider to know exactly how strong your contractions are or to see exactly what the baby's heart rate is doing, an internal monitor may be inserted into your vagina and uterus. Your health care provider will discuss the benefits and risks of using an internal monitor and obtain your permission before inserting the device. °¨ Continuous fetal monitoring may be needed if you have an epidural, are receiving certain medicines (such as oxytocin), or have pregnancy or labor complications. °· An IV access tube may be placed into a vein in your arm to deliver fluids and medicines if necessary. °THREE STAGES OF LABOR AND DELIVERY °Normal labor and delivery is divided into three stages. °First Stage °This stage starts when you begin to contract regularly and your cervix begins to efface and dilate. It ends when your cervix is completely open (fully dilated). The first stage is the longest stage of labor and can last from 3 hours to 15 hours.  °Several methods are available to help with labor pain. You and your health care provider will decide which option is best for you. Options include:  °· Opioid medicines. These are strong pain medicines that you can get through your IV tube or as a shot into your muscle. These medicines lessen pain but do not make it go away completely.  °· Epidural. A medicine is given through a thin tube that   is inserted in your back. The medicine numbs the lower part of your body and prevents any pain in that area. °· Paracervical pain medicine. This is an injection of an anesthetic on each side of your cervix.   °· You may request natural childbirth, which does not involve the use of pain medicines or an epidural during labor and delivery. Instead, you will use other things, such as breathing exercises, to help cope with the pain. °Second Stage °The second stage of labor  begins when your cervix is fully dilated at 10 cm. It continues until you push your baby down through the birth canal and the baby is born. This stage can take only minutes or several hours. °· The location of your baby's head as it moves through the birth canal is reported as a number called a station. If the baby's head has not started its descent, the station is described as being at minus 3 (-3). When your baby's head is at the zero station, it is at the middle of the birth canal and is engaged in the pelvis. The station of your baby helps indicate the progress of the second stage of labor. °· When your baby is born, your health care provider may hold the baby with his or her head lowered to prevent amniotic fluid, mucus, and blood from getting into the baby's lungs. The baby's mouth and nose may be suctioned with a small bulb syringe to remove any additional fluid. °· Your health care provider may then place the baby on your stomach. It is important to keep the baby from getting cold. To do this, the health care provider will dry the baby off, place the baby directly on your skin (with no blankets between you and the baby), and cover the baby with warm, dry blankets.   °· The umbilical cord is cut. °Third Stage °During the third stage of labor, your health care provider will deliver the placenta (afterbirth) and make sure your bleeding is under control. The delivery of the placenta usually takes about 5 minutes but can take up to 30 minutes. After the placenta is delivered, a medicine may be given either by IV or injection to help contract the uterus and control bleeding. If you are planning to breastfeed, you can try to do so now. °After you deliver the placenta, your uterus should contract and get very firm. If your uterus does not remain firm, your health care provider will massage it. This is important because the contraction of the uterus helps cut off bleeding at the site where the placenta was attached  to your uterus. If your uterus does not contract properly and stay firm, you may continue to bleed heavily. If there is a lot of bleeding, medicines may be given to contract the uterus and stop the bleeding.  °  °This information is not intended to replace advice given to you by your health care provider. Make sure you discuss any questions you have with your health care provider. °  °Document Released: 07/31/2008 Document Revised: 11/12/2014 Document Reviewed: 06/18/2012 °Elsevier Interactive Patient Education ©2016 [employer] °Third Trimester of Pregnancy °The third trimester is from week 29 through week 42, months 7 through 9. The third trimester is a time when the fetus is growing rapidly. At the end of the ninth month, the fetus is about 20 inches in length and weighs 6-10 pounds.  °BODY CHANGES °Your body goes through many changes during pregnancy. The changes vary from woman to woman.  °·   Your weight will continue to increase. You can expect to gain 25-35 pounds (11-16 kg) by the end of the pregnancy. °· You may begin to get stretch marks on your hips, abdomen, and breasts. °· You may urinate more often because the fetus is moving lower into your pelvis and pressing on your bladder. °· You may develop or continue to have heartburn as a result of your pregnancy. °· You may develop constipation because certain hormones are causing the muscles that push waste through your intestines to slow down. °· You may develop hemorrhoids or swollen, bulging veins (varicose veins). °· You may have pelvic pain because of the weight gain and pregnancy hormones relaxing your joints between the bones in your pelvis. Backaches may result from overexertion of the muscles supporting your posture. °· You may have changes in your hair. These can include thickening of your hair, rapid growth, and changes in texture. Some women also have hair loss during or after pregnancy, or hair that feels dry or thin. Your hair will most likely  return to normal after your baby is born. °· Your breasts will continue to grow and be tender. A yellow discharge may leak from your breasts called colostrum. °· Your belly button may stick out. °· You may feel short of breath because of your expanding uterus. °· You may notice the fetus "dropping," or moving lower in your abdomen. °· You may have a bloody mucus discharge. This usually occurs a few days to a week before labor begins. °· Your cervix becomes thin and soft (effaced) near your due date. °WHAT TO EXPECT AT YOUR PRENATAL EXAMS  °You will have prenatal exams every 2 weeks until week 36. Then, you will have weekly prenatal exams. During a routine prenatal visit: °· You will be weighed to make sure you and the fetus are growing normally. °· Your blood pressure is taken. °· Your abdomen will be measured to track your baby's growth. °· The fetal heartbeat will be listened to. °· Any test results from the previous visit will be discussed. °· You may have a cervical check near your due date to see if you have effaced. °At around 36 weeks, your caregiver will check your cervix. At the same time, your caregiver will also perform a test on the secretions of the vaginal tissue. This test is to determine if a type of bacteria, Group B streptococcus, is present. Your caregiver will explain this further. °Your caregiver may ask you: °· What your birth plan is. °· How you are feeling. °· If you are feeling the baby move. °· If you have had any abnormal symptoms, such as leaking fluid, bleeding, severe headaches, or abdominal cramping. °· If you are using any tobacco products, including cigarettes, chewing tobacco, and electronic cigarettes. °· If you have any questions. °Other tests or screenings that may be performed during your third trimester include: °· Blood tests that check for low iron levels (anemia). °· Fetal testing to check the health, activity level, and growth of the fetus. Testing is done if you have  certain medical conditions or if there are problems during the pregnancy. °· HIV (human immunodeficiency virus) testing. If you are at high risk, you may be screened for HIV during your third trimester of pregnancy. °FALSE LABOR °You may feel small, irregular contractions that eventually go away. These are called Braxton Hicks contractions, or false labor. Contractions may last for hours, days, or even weeks before true labor sets in. If contractions come   at regular intervals, intensify, or become painful, it is best to be seen by your caregiver.  °SIGNS OF LABOR  °· Menstrual-like cramps. °· Contractions that are 5 minutes apart or less. °· Contractions that start on the top of the uterus and spread down to the lower abdomen and back. °· A sense of increased pelvic pressure or back pain. °· A watery or bloody mucus discharge that comes from the vagina. °If you have any of these signs before the 37th week of pregnancy, call your caregiver right away. You need to go to the hospital to get checked immediately. °HOME CARE INSTRUCTIONS  °· Avoid all smoking, herbs, alcohol, and unprescribed drugs. These chemicals affect the formation and growth of the baby. °· Do not use any tobacco products, including cigarettes, chewing tobacco, and electronic cigarettes. If you need help quitting, ask your health care provider. You may receive counseling support and other resources to help you quit. °· Follow your caregiver's instructions regarding medicine use. There are medicines that are either safe or unsafe to take during pregnancy. °· Exercise only as directed by your caregiver. Experiencing uterine cramps is a good sign to stop exercising. °· Continue to eat regular, healthy meals. °· Wear a good support bra for breast tenderness. °· Do not use hot tubs, steam rooms, or saunas. °· Wear your seat belt at all times when driving. °· Avoid raw meat, uncooked cheese, cat litter boxes, and soil used by cats. These carry germs that  can cause birth defects in the baby. °· Take your prenatal vitamins. °· Take 1500-2000 mg of calcium daily starting at the 20th week of pregnancy until you deliver your baby. °· Try taking a stool softener (if your caregiver approves) if you develop constipation. Eat more high-fiber foods, such as fresh vegetables or fruit and whole grains. Drink plenty of fluids to keep your urine clear or pale yellow. °· Take warm sitz baths to soothe any pain or discomfort caused by hemorrhoids. Use hemorrhoid cream if your caregiver approves. °· If you develop varicose veins, wear support hose. Elevate your feet for 15 minutes, 3-4 times a day. Limit salt in your diet. °· Avoid heavy lifting, wear low heal shoes, and practice good posture. °· Rest a lot with your legs elevated if you have leg cramps or low back pain. °· Visit your dentist if you have not gone during your pregnancy. Use a soft toothbrush to brush your teeth and be gentle when you floss. °· A sexual relationship may be continued unless your caregiver directs you otherwise. °· Do not travel far distances unless it is absolutely necessary and only with the approval of your caregiver. °· Take prenatal classes to understand, practice, and ask questions about the labor and delivery. °· Make a trial run to the hospital. °· Pack your hospital bag. °· Prepare the baby's nursery. °· Continue to go to all your prenatal visits as directed by your caregiver. °SEEK MEDICAL CARE IF: °· You are unsure if you are in labor or if your water has broken. °· You have dizziness. °· You have mild pelvic cramps, pelvic pressure, or nagging pain in your abdominal area. °· You have persistent nausea, vomiting, or diarrhea. °· You have a bad smelling vaginal discharge. °· You have pain with urination. °SEEK IMMEDIATE MEDICAL CARE IF:  °· You have a fever. °· You are leaking fluid from your vagina. °· You have spotting or bleeding from your vagina. °· You have severe abdominal cramping or    pain. °· You have rapid weight loss or gain. °· You have shortness of breath with chest pain. °· You notice sudden or extreme swelling of your face, hands, ankles, feet, or legs. °· You have not felt your baby move in over an hour. °· You have severe headaches that do not go away with medicine. °· You have vision changes. °  °This information is not intended to replace advice given to you by your health care provider. Make sure you discuss any questions you have with your health care provider. °  °Document Released: 10/16/2001 Document Revised: 11/12/2014 Document Reviewed: 12/23/2012 °Elsevier Interactive Patient Education ©2016 [employer] ° °

## 2016-06-08 NOTE — Progress Notes (Signed)
Patient ID: Jeanne Lynch, female   DOB: 10/23/1981, 35 y.o.   MRN: [id number]

## 2016-06-08 NOTE — Progress Notes (Signed)
Subjective:  Jeanne Lynch is a 35 y.o. G1P0 at [redacted]w[redacted]d being seen today for ongoing prenatal care.  She is currently monitored for the following issues for this low-risk pregnancy and has Acute rhinosinusitis; Supervision of normal pregnancy, antepartum; Genital herpes affecting pregnancy; Low lying placenta, antepartum; and Irregular mole on her problem list.  Patient reports no complaints.  Contractions: Not present. Vag. Bleeding: None.  Movement: Present. Denies leaking of fluid.   The following portions of the patient's history were reviewed and updated as appropriate: allergies, current medications, past family history, past medical history, past social history, past surgical history and problem list. Problem list updated.  Objective:   Vitals:   06/08/16 0818  BP: 128/74  Pulse: 73  Weight: 167 lb (75.8 kg)    Fetal Status: Fetal Heart Rate (bpm): 143   Movement: Present     General:  Alert, oriented and cooperative. Patient is in no acute distress.  Skin: Skin is warm and dry. No rash noted.   Cardiovascular: Normal heart rate noted  Respiratory: Normal respiratory effort, no problems with respiration noted  Abdomen: Soft, gravid, appropriate for gestational age. Pain/Pressure: Present     Pelvic:  Cervical exam performed        Extremities: Normal range of motion.  Edema: None  Mental Status: Normal mood and affect. Normal behavior. Normal judgment and thought content.  Cervix closed/70/-3/vertex  Urinalysis: Urine Protein: Negative Urine Glucose: Negative  Assessment and Plan:  Pregnancy: G1P0 at [redacted]w[redacted]d  There are no diagnoses linked to this encounter. Term labor symptoms and general obstetric precautions including but not limited to vaginal bleeding, contractions, leaking of fluid and fetal movement were reviewed in detail with the patient. Please refer to After Visit Summary for other counseling recommendations.  Return in about 1 week (around 06/15/2016) for  [employer].   Aviva Signs, CNM

## 2016-06-15 ENCOUNTER — Ambulatory Visit (INDEPENDENT_AMBULATORY_CARE_PROVIDER_SITE_OTHER): Payer: Commercial Managed Care - HMO | Admitting: Family

## 2016-06-15 VITALS — BP 137/88 | HR 82 | Wt 167.0 lb

## 2016-06-15 DIAGNOSIS — A609 Anogenital herpesviral infection, unspecified: Secondary | ICD-10-CM

## 2016-06-15 DIAGNOSIS — Z3483 Encounter for supervision of other normal pregnancy, third trimester: Secondary | ICD-10-CM

## 2016-06-15 DIAGNOSIS — O98513 Other viral diseases complicating pregnancy, third trimester: Secondary | ICD-10-CM

## 2016-06-15 DIAGNOSIS — A6009 Herpesviral infection of other urogenital tract: Secondary | ICD-10-CM

## 2016-06-15 DIAGNOSIS — O98319 Other infections with a predominantly sexual mode of transmission complicating pregnancy, unspecified trimester: Principal | ICD-10-CM

## 2016-06-15 DIAGNOSIS — O4440 Low lying placenta NOS or without hemorrhage, unspecified trimester: Secondary | ICD-10-CM

## 2016-06-15 NOTE — Progress Notes (Signed)
Subjective:  Jeanne Lynch is a 35 y.o. G1P0 at 4954w6d being seen today for ongoing prenatal care.  She is currently monitored for the following issues for this low-risk pregnancy and has Acute rhinosinusitis; Supervision of normal pregnancy, antepartum; Genital herpes affecting pregnancy; Low lying placenta, antepartum; and Irregular mole on her problem list.  Patient reports occasional contractions.  Contractions: Irregular. Vag. Bleeding: None.  Movement: Present. Denies leaking of fluid.   The following portions of the patient's history were reviewed and updated as appropriate: allergies, current medications, past family history, past medical history, past social history, past surgical history and problem list. Problem list updated.  Objective:   Vitals:   06/15/16 1038  BP: 137/88  Pulse: 82  Weight: 167 lb (75.8 kg)    Fetal Status: Fetal Heart Rate (bpm): 144   Movement: Present     General:  Alert, oriented and cooperative. Patient is in no acute distress.  Skin: Skin is warm and dry. No rash noted.   Cardiovascular: Normal heart rate noted  Respiratory: Normal respiratory effort, no problems with respiration noted  Abdomen: Soft, gravid, appropriate for gestational age. Pain/Pressure: Present     Pelvic:  Cervical exam performed      closed/60  Extremities: Normal range of motion.  Edema: Trace  Mental Status: Normal mood and affect. Normal behavior. Normal judgment and thought content.   Urinalysis: Urine Protein: Negative Urine Glucose: Negative  Assessment and Plan:  Pregnancy: G1P0 at 2954w6d  1. Genital herpes affecting pregnancy - Continue prophylaxis  2.Supervision of normal pregnancy, antepartum, third trimester - Discussed signs of labor - NST - Discussed Primrose Oil  Term labor symptoms and general obstetric precautions including but not limited to vaginal bleeding, contractions, leaking of fluid and fetal movement were reviewed in detail with the  patient. Please refer to After Visit Summary for other counseling recommendations.  Return in about 1 week (around 06/22/2016).  Eino FarberWalidah Kennith GainN Karim, CNM

## 2016-06-15 NOTE — Patient Instructions (Signed)
PRIMROSE OIL

## 2016-06-22 ENCOUNTER — Ambulatory Visit (INDEPENDENT_AMBULATORY_CARE_PROVIDER_SITE_OTHER): Payer: Commercial Managed Care - HMO | Admitting: Advanced Practice Midwife

## 2016-06-22 VITALS — BP 131/83 | Wt 168.0 lb

## 2016-06-22 DIAGNOSIS — Z3403 Encounter for supervision of normal first pregnancy, third trimester: Secondary | ICD-10-CM

## 2016-06-22 NOTE — Patient Instructions (Signed)

## 2016-06-22 NOTE — Progress Notes (Signed)
Subjective:  Jeanne Lynch is a 35 y.o. G1P0 at 5250w6d being seen today for ongoing prenatal care.  She is currently monitored for the following issues for this low-risk pregnancy and has Acute rhinosinusitis; Supervision of normal pregnancy, antepartum; Genital herpes affecting pregnancy; Low lying placenta, antepartum; and Irregular mole on her problem list.  Patient reports occasional contractions.  Contractions: Irregular. Vag. Bleeding: None.  Movement: Present. Denies leaking of fluid.   The following portions of the patient's history were reviewed and updated as appropriate: allergies, current medications, past family history, past medical history, past social history, past surgical history and problem list. Problem list updated.  Objective:   Vitals:   06/22/16 0835  BP: 131/83  Weight: 168 lb (76.2 kg)    Fetal Status: Fetal Heart Rate (bpm): 147 Fundal Height: 38 cm Movement: Present  Presentation: Vertex  General:  Alert, oriented and cooperative. Patient is in no acute distress.  Skin: Skin is warm and dry. No rash noted.   Cardiovascular: Normal heart rate noted  Respiratory: Normal respiratory effort, no problems with respiration noted  Abdomen: Soft, gravid, appropriate for gestational age. Pain/Pressure: Present     Pelvic:  Cervical exam performed Dilation: 1 Effacement (%): 70 Station: -2  Extremities: Normal range of motion.  Edema: Trace  Mental Status: Normal mood and affect. Normal behavior. Normal judgment and thought content.   Urinalysis: Urine Protein: Negative Urine Glucose: Negative  Assessment and Plan:  Pregnancy: G1P0 at 7750w6d  1. Supervision of normal first pregnancy in third trimester --Discussed options to begin labor including Evening Primrose Oil, sweeping membranes in office, Castor oil, etc.  Pt to try EPO, will wait on membrane sweeping and castor oil for now.  Appt for NST next week if no labor onset.  Term labor symptoms and general  obstetric precautions including but not limited to vaginal bleeding, contractions, leaking of fluid and fetal movement were reviewed in detail with the patient. Please refer to After Visit Summary for other counseling recommendations.  Return in about 1 week (around 06/29/2016).   Hurshel PartyLisa A Leftwich-Kirby, CNM

## 2016-06-24 ENCOUNTER — Inpatient Hospital Stay (HOSPITAL_COMMUNITY)
Admission: AD | Admit: 2016-06-24 | Discharge: 2016-06-24 | Disposition: A | Discharge status: Home / Self Care | Payer: Commercial Managed Care - HMO | Source: Ambulatory Visit | Attending: Obstetrics & Gynecology | Admitting: Obstetrics & Gynecology

## 2016-06-24 ENCOUNTER — Encounter (HOSPITAL_COMMUNITY): Payer: Self-pay | Admitting: *Deleted

## 2016-06-24 ENCOUNTER — Inpatient Hospital Stay (HOSPITAL_COMMUNITY)
Admission: AD | Admit: 2016-06-24 | Discharge: 2016-06-26 | DRG: 767 | Disposition: A | Discharge status: Home / Self Care | Payer: Commercial Managed Care - HMO | Source: Ambulatory Visit | Attending: Obstetrics and Gynecology | Admitting: Obstetrics and Gynecology

## 2016-06-24 DIAGNOSIS — Z3403 Encounter for supervision of normal first pregnancy, third trimester: Secondary | ICD-10-CM | POA: Diagnosis present

## 2016-06-24 DIAGNOSIS — O4440 Low lying placenta NOS or without hemorrhage, unspecified trimester: Secondary | ICD-10-CM

## 2016-06-24 DIAGNOSIS — Z3A40 40 weeks gestation of pregnancy: Secondary | ICD-10-CM

## 2016-06-24 DIAGNOSIS — Z3483 Encounter for supervision of other normal pregnancy, third trimester: Secondary | ICD-10-CM

## 2016-06-24 DIAGNOSIS — O98319 Other infections with a predominantly sexual mode of transmission complicating pregnancy, unspecified trimester: Secondary | ICD-10-CM

## 2016-06-24 DIAGNOSIS — O471 False labor at or after 37 completed weeks of gestation: Secondary | ICD-10-CM | POA: Insufficient documentation

## 2016-06-24 DIAGNOSIS — O480 Post-term pregnancy: Secondary | ICD-10-CM | POA: Diagnosis not present

## 2016-06-24 DIAGNOSIS — A6009 Herpesviral infection of other urogenital tract: Secondary | ICD-10-CM

## 2016-06-24 MED ORDER — LACTATED RINGERS IV SOLN: 500.0000 mL | INTRAVENOUS | Status: DC | PRN

## 2016-06-24 MED ORDER — OXYTOCIN 40 UNITS IN LACTATED RINGERS INFUSION - SIMPLE MED
2.5000 [IU]/h | INTRAVENOUS | Status: DC
  Filled 2016-06-24: qty 1000

## 2016-06-24 MED ORDER — SOD CITRATE-CITRIC ACID 500-334 MG/5ML PO SOLN: 30.0000 mL | ORAL | Status: DC | PRN

## 2016-06-24 MED ORDER — LACTATED RINGERS IV SOLN
INTRAVENOUS | Status: DC
  Administered 2016-06-25: 01:00:00 via INTRAVENOUS

## 2016-06-24 MED ORDER — OXYCODONE-ACETAMINOPHEN 5-325 MG PO TABS
2.0000 | ORAL_TABLET | ORAL | Status: DC | PRN
Start: 2016-06-24 — End: 2016-06-25

## 2016-06-24 MED ORDER — ACETAMINOPHEN 325 MG PO TABS: 650.0000 mg | ORAL_TABLET | ORAL | Status: DC | PRN

## 2016-06-24 MED ORDER — LIDOCAINE HCL (PF) 1 % IJ SOLN
30.0000 mL | INTRAMUSCULAR | Status: AC | PRN
  Administered 2016-06-25 (×2): 30 mL via SUBCUTANEOUS
  Filled 2016-06-24 (×2): qty 30

## 2016-06-24 MED ORDER — OXYTOCIN BOLUS FROM INFUSION
500.0000 mL | Freq: Once | INTRAVENOUS | Status: AC
  Administered 2016-06-25: 500 mL via INTRAVENOUS

## 2016-06-24 MED ORDER — ONDANSETRON HCL 4 MG/2ML IJ SOLN: 4.0000 mg | Freq: Four times a day (QID) | INTRAMUSCULAR | Status: DC | PRN

## 2016-06-24 MED ORDER — OXYCODONE-ACETAMINOPHEN 5-325 MG PO TABS: 1.0000 | ORAL_TABLET | ORAL | Status: DC | PRN

## 2016-06-24 MED ORDER — FLEET ENEMA 7-19 GM/118ML RE ENEM: 1.0000 | ENEMA | RECTAL | Status: DC | PRN

## 2016-06-24 NOTE — MAU Note (Signed)
Pt reports contractions since 8pm every 6mins. Denies LOF. Has some brown discharge. +FM Was 1cm on Friday.

## 2016-06-24 NOTE — MAU Note (Signed)
Pt presents complaining of worsening contractions since 2100. Denies leaking or bleeding. Reports good fetal movement.

## 2016-06-24 NOTE — H&P (Signed)
Jeanne Lynch is a 35 y.o. female G1 @ 40.1 wks presenting for labor. States contractions started at 2100, every 2-3 mins. Denies ROM, vag bleeding. GBS neg.  OB History    Gravida Para Term Preterm AB Living   1             SAB TAB Ectopic Multiple Live Births                 Past Medical History:  Diagnosis Date  . Migraines    Past Surgical History:  Procedure Laterality Date  . NO PAST SURGERIES    . WISDOM TOOTH EXTRACTION     Family History: family history is not on file. Social History:  reports that she has never smoked. She has never used smokeless tobacco. She reports that she does not drink alcohol or use drugs.     Maternal Diabetes: No Genetic Screening: Normal Maternal Ultrasounds/Referrals: Normal Fetal Ultrasounds or other Referrals:  None Maternal Substance Abuse:  No Significant Maternal Medications:  None Significant Maternal Lab Results:  None Other Comments:  None  Review of Systems  Constitutional: Negative.   HENT: Negative.   Eyes: Negative.   Respiratory: Negative.   Cardiovascular: Negative.   Gastrointestinal: Positive for abdominal pain.  Genitourinary: Negative.   Musculoskeletal: Positive for back pain.  Skin: Negative.   Neurological: Negative.   Endo/Heme/Allergies: Negative.   Psychiatric/Behavioral: Negative.    Maternal Medical History:  Reason for admission: Contractions.   Contractions: Onset was 3-5 hours ago.   Frequency: regular.   Duration is approximately 60 seconds.   Perceived severity is moderate.    Fetal activity: Perceived fetal activity is normal.   Last perceived fetal movement was within the past hour.    Prenatal complications: no prenatal complications Prenatal Complications - Diabetes: none.    Dilation: 3.5 Effacement (%): 100 Station: -2 Exam by:: K. WeissRN Blood pressure 126/83, pulse 65, temperature 98.3 F (36.8 C), resp. rate 16, height 5\' 10"  (1.778 m), weight 77.1 kg (170 lb), last  menstrual period 09/12/2015. Maternal Exam:  Uterine Assessment: Contraction strength is moderate.  Contraction frequency is regular.   Abdomen: Patient reports no abdominal tenderness. Estimated fetal weight is 7-0.   Fetal presentation: vertex  Introitus: Normal vulva. Normal vagina.  Ferning test: not done.  Nitrazine test: not done. Amniotic fluid character: not assessed.  Pelvis: adequate for delivery.   Cervix: Cervix evaluated by digital exam.     Fetal Exam Fetal Monitor Review: Mode: ultrasound.   Variability: moderate (6-25 bpm).   Pattern: accelerations present.    Fetal State Assessment: Category I - tracings are normal.     Physical Exam  Constitutional: She is oriented to person, place, and time. She appears well-developed and well-nourished.  HENT:  Head: Normocephalic.  Eyes: Pupils are equal, round, and reactive to light.  Neck: Normal range of motion.  Cardiovascular: Normal rate, regular rhythm and normal heart sounds.   Respiratory: Effort normal and breath sounds normal.  GI: Soft. Bowel sounds are normal.  Genitourinary: Vagina normal and uterus normal.  Musculoskeletal: Normal range of motion.  Neurological: She is alert and oriented to person, place, and time. She has normal reflexes.  Skin: Skin is warm and dry.  Psychiatric: She has a normal mood and affect. Her behavior is normal. Judgment and thought content normal.    Prenatal labs: ABO, Rh: A/POS/-- (01/20 1002) Antibody: NEG (01/20 1002) Rubella: 7.83 (01/20 1002) RPR: NON REAC (05/26 1130)  HBsAg:  NEGATIVE (01/20 1002)  HIV: NONREACTIVE (05/26 1130)  GBS:     Assessment/Plan:  SVE 3.5/100/-2. GBS neg.  Admit to labor and delivery.  Jeanne Lynch 06/24/2016, 11:09 PM

## 2016-06-25 ENCOUNTER — Encounter (HOSPITAL_COMMUNITY): Payer: Self-pay | Admitting: *Deleted

## 2016-06-25 DIAGNOSIS — O480 Post-term pregnancy: Secondary | ICD-10-CM

## 2016-06-25 DIAGNOSIS — Z3A40 40 weeks gestation of pregnancy: Secondary | ICD-10-CM

## 2016-06-25 LAB — CBC
HEMATOCRIT: 36.7 % (ref 36.0–46.0)
HEMATOCRIT: 35.5 % — AB (ref 36.0–46.0)
Hemoglobin: 13 g/dL (ref 12.0–15.0)
Hemoglobin: 12.3 g/dL (ref 12.0–15.0)
MCH: 30.5 pg (ref 26.0–34.0)
MCH: 30.2 pg (ref 26.0–34.0)
MCHC: 35.4 g/dL (ref 30.0–36.0)
MCHC: 34.6 g/dL (ref 30.0–36.0)
MCV: 86.2 fL (ref 78.0–100.0)
MCV: 87.2 fL (ref 78.0–100.0)
PLATELETS: 194 10*3/uL (ref 150–400)
Platelets: 237 10*3/uL (ref 150–400)
RBC: 4.26 MIL/uL (ref 3.87–5.11)
RBC: 4.07 MIL/uL (ref 3.87–5.11)
RDW: 13 % (ref 11.5–15.5)
RDW: 12.9 % (ref 11.5–15.5)
WBC: 15.7 10*3/uL — AB (ref 4.0–10.5)
WBC: 23 10*3/uL — ABNORMAL HIGH (ref 4.0–10.5)

## 2016-06-25 LAB — TYPE AND SCREEN
ABO/RH(D): A POS
ANTIBODY SCREEN: NEGATIVE

## 2016-06-25 LAB — ABO/RH: ABO/RH(D): A POS

## 2016-06-25 LAB — RPR: RPR: NONREACTIVE

## 2016-06-25 MED ORDER — DIBUCAINE 1 % RE OINT
1.0000 | TOPICAL_OINTMENT | RECTAL | Status: DC | PRN
Start: 2016-06-25 — End: 2016-06-26

## 2016-06-25 MED ORDER — WITCH HAZEL-GLYCERIN EX PADS: 1.0000 "application " | MEDICATED_PAD | CUTANEOUS | Status: DC | PRN

## 2016-06-25 MED ORDER — ONDANSETRON HCL 4 MG/2ML IJ SOLN: 4.0000 mg | INTRAMUSCULAR | Status: DC | PRN

## 2016-06-25 MED ORDER — IBUPROFEN 600 MG PO TABS: 600.0000 mg | ORAL_TABLET | Freq: Four times a day (QID) | ORAL | Status: DC

## 2016-06-25 MED ORDER — SENNOSIDES-DOCUSATE SODIUM 8.6-50 MG PO TABS: 2.0000 | ORAL_TABLET | ORAL | Status: DC

## 2016-06-25 MED ORDER — PRENATAL MULTIVITAMIN CH
1.0000 | ORAL_TABLET | Freq: Every day | ORAL | Status: DC
  Administered 2016-06-25 – 2016-06-26 (×2): 1 via ORAL
  Filled 2016-06-25 (×2): qty 1

## 2016-06-25 MED ORDER — ONDANSETRON HCL 4 MG PO TABS: 4.0000 mg | ORAL_TABLET | ORAL | Status: DC | PRN

## 2016-06-25 MED ORDER — SODIUM CHLORIDE 0.9 % IV SOLN: 250.0000 mL | INTRAVENOUS | Status: DC | PRN

## 2016-06-25 MED ORDER — SIMETHICONE 80 MG PO CHEW: 80.0000 mg | CHEWABLE_TABLET | ORAL | Status: DC | PRN

## 2016-06-25 MED ORDER — PRENATAL MULTIVITAMIN CH: 1.0000 | ORAL_TABLET | Freq: Every day | ORAL | Status: DC

## 2016-06-25 MED ORDER — BENZOCAINE-MENTHOL 20-0.5 % EX AERO
1.0000 "application " | INHALATION_SPRAY | CUTANEOUS | Status: DC | PRN
  Administered 2016-06-25: 1 via TOPICAL
  Filled 2016-06-25: qty 56

## 2016-06-25 MED ORDER — DIPHENHYDRAMINE HCL 25 MG PO CAPS: 25.0000 mg | ORAL_CAPSULE | Freq: Four times a day (QID) | ORAL | Status: DC | PRN

## 2016-06-25 MED ORDER — FLUTICASONE PROPIONATE 50 MCG/ACT NA SUSP
2.0000 | NASAL | Status: DC | PRN
  Filled 2016-06-25: qty 16

## 2016-06-25 MED ORDER — SODIUM CHLORIDE 0.9% FLUSH: 3.0000 mL | Freq: Two times a day (BID) | INTRAVENOUS | Status: DC

## 2016-06-25 MED ORDER — ACETAMINOPHEN 325 MG PO TABS: 650.0000 mg | ORAL_TABLET | ORAL | Status: DC | PRN

## 2016-06-25 MED ORDER — ZOLPIDEM TARTRATE 5 MG PO TABS: 5.0000 mg | ORAL_TABLET | Freq: Every evening | ORAL | Status: DC | PRN

## 2016-06-25 MED ORDER — COCONUT OIL OIL: 1.0000 "application " | TOPICAL_OIL | Status: DC | PRN

## 2016-06-25 MED ORDER — SIMETHICONE 80 MG PO CHEW
80.0000 mg | CHEWABLE_TABLET | ORAL | Status: DC | PRN
  Filled 2016-06-25: qty 1

## 2016-06-25 MED ORDER — OXYTOCIN 10 UNIT/ML IJ SOLN
INTRAMUSCULAR | Status: AC
  Filled 2016-06-25: qty 2

## 2016-06-25 MED ORDER — IBUPROFEN 600 MG PO TABS
600.0000 mg | ORAL_TABLET | Freq: Four times a day (QID) | ORAL | Status: DC
  Administered 2016-06-25 – 2016-06-26 (×7): 600 mg via ORAL
  Filled 2016-06-25 (×7): qty 1

## 2016-06-25 MED ORDER — SENNOSIDES-DOCUSATE SODIUM 8.6-50 MG PO TABS
2.0000 | ORAL_TABLET | ORAL | Status: DC
  Filled 2016-06-25: qty 2

## 2016-06-25 MED ORDER — TETANUS-DIPHTH-ACELL PERTUSSIS 5-2.5-18.5 LF-MCG/0.5 IM SUSP: 0.5000 mL | Freq: Once | INTRAMUSCULAR | Status: DC

## 2016-06-25 MED ORDER — OXYCODONE HCL 5 MG PO TABS: 10.0000 mg | ORAL_TABLET | ORAL | Status: DC | PRN

## 2016-06-25 MED ORDER — SODIUM CHLORIDE 0.9% FLUSH: 3.0000 mL | INTRAVENOUS | Status: DC | PRN

## 2016-06-25 MED ORDER — OXYCODONE HCL 5 MG PO TABS: 5.0000 mg | ORAL_TABLET | ORAL | Status: DC | PRN

## 2016-06-25 MED ORDER — BENZOCAINE-MENTHOL 20-0.5 % EX AERO: 1.0000 "application " | INHALATION_SPRAY | CUTANEOUS | Status: DC | PRN

## 2016-06-25 MED ORDER — MEASLES, MUMPS & RUBELLA VAC ~~LOC~~ INJ
0.5000 mL | INJECTION | Freq: Once | SUBCUTANEOUS | Status: DC
  Filled 2016-06-25: qty 0.5

## 2016-06-25 NOTE — Progress Notes (Signed)
Patient ID: Jeanne Lynch, female   DOB: 1981/10/07, 35 y.o.   MRN: [id number] Delivery Note At 2:27 AM a viable and healthy female was delivered via Vaginal, Spontaneous Delivery (Presentation: ;  ).  APGAR: 8, 9; weight  .   Placenta status: , .  Cord:  with the following complications: retained placental membrane fragments, requiring 7-9 efforts with ring forceps inside the uterus to complete extraction of placental membrane remnants..  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 Blood Loss (mL):   450 Mom to postpartum.  Baby to Couplet care / Skin to Skin.  Cerami,Juhi Lagrange V 06/25/2016, 3:05 AM

## 2016-06-25 NOTE — Lactation Note (Signed)
This note was copied from a baby's chart. Lactation Consultation Note Initial visit at 15 hours of age.  Mom reports good feedings and denies pain with latch.  Previous LATCH scores by RN are "9-10."  Baby has had 8 recorded feedings with good output.  Baby is asleep in crib.  [facility] resources given and discussed.  Encouraged to feed with early cues on demand.  Early newborn behavior discussed.  Hand expression demonstrated by mom with colostrum visible and leaking at previous piercings.  Mom to call for assist as needed.    Patient Name: Jeanne Lynch UEAVW'UToday's Date: 06/25/2016 Reason for consult: Initial assessment   Maternal Data Has patient been taught Hand Expression?: Yes Does the patient have breastfeeding experience prior to this delivery?: No  Feeding Feeding Type: Breast Fed Length of feed: 16 min  LATCH Score/Interventions Latch: Grasps breast easily, tongue down, lips flanged, rhythmical sucking.  Audible Swallowing: Spontaneous and intermittent  Type of Nipple: Everted at rest and after stimulation  Comfort (Breast/Nipple): Filling, red/small blisters or bruises, mild/mod discomfort  Problem noted: Mild/Moderate discomfort  Hold (Positioning): No assistance needed to correctly position infant at breast. Intervention(s): Breastfeeding basics reviewed  LATCH Score: 9  Lactation Tools Discussed/Used     Consult Status Consult Status: Follow-up Date: 06/26/16 Follow-up type: In-patient    [name], Arvella MerlesJana Lynn 06/25/2016, 5:49 PM

## 2016-06-26 ENCOUNTER — Other Ambulatory Visit: Payer: Commercial Managed Care - HMO

## 2016-06-26 NOTE — Lactation Note (Signed)
This note was copied from a baby's chart. Lactation Consultation Note Mom states baby is BF well and hopes to go home today. Watched latch, mom and baby done well. Mom has good everted nipples. Skin intact. Discussed engorgement, filling, transitional milk, mature milk, Supply and demand. [facility]/LC brochure given w/resources, support groups and LC services.  Patient Name: Jeanne Lynch UEAVW'UToday's Date: 06/26/2016 Reason for consult: Follow-up assessment   Maternal Data    Feeding Feeding Type: Breast Fed Length of feed: 15 min  LATCH Score/Interventions Latch: Grasps breast easily, tongue down, lips flanged, rhythmical sucking.  Audible Swallowing: A few with stimulation  Type of Nipple: Everted at rest and after stimulation  Comfort (Breast/Nipple): Filling, red/small blisters or bruises, mild/mod discomfort  Problem noted: Mild/Moderate discomfort Interventions (Mild/moderate discomfort): Hand massage;Hand expression  Hold (Positioning): No assistance needed to correctly position infant at breast. Intervention(s): Position options;Support Pillows;Skin to skin  LATCH Score: 8  Lactation Tools Discussed/Used     Consult Status Consult Status: Complete Date: 06/26/16 Follow-up type: In-patient    Charyl DancerCARVER, Lynk Marti G 06/26/2016, 5:49 AM

## 2016-06-26 NOTE — Discharge Instructions (Signed)

## 2016-06-26 NOTE — Discharge Summary (Signed)
OB Discharge Summary     Patient Name: Marlow BaarsCorinna Ortman DOB: 1981-09-28 MRN: [id number]  Date of admission: 06/24/2016 Delivering MD: Tilda BurrowFERGUSON, JOHN V   Date of discharge: 06/26/2016  Admitting diagnosis: 40 WKS, CTXS Intrauterine pregnancy: 1941w2d     Secondary diagnosis:  Active Problems:   Normal labor   Retained placenta or amniotic membranes with no hemorrhage  Additional problems: none     Discharge diagnosis: Term Pregnancy Delivered                                                                                                Post partum procedures:manual removal of placenta  Augmentation: AROM  Complications: None  Hospital course:  Onset of Labor With Vaginal Delivery     35 y.o. yo G1P1001 at 10841w2d was admitted in Latent Labor on 06/24/2016. Patient had an uncomplicated labor course as follows:  Membrane Rupture Time/Date: 1:10 AM ,06/25/2016   Intrapartum Procedures: Episiotomy: None [1]                                         Lacerations:  2nd degree [3]  Patient had a delivery of a Viable infant. 06/25/2016  Information for the patient's newborn:  Sula SodaFerguson, Girl Amadi [[id number]][[id number]]  Delivery Method: Vaginal, Spontaneous Delivery (Filed from Delivery Summary)    Pateint had an uncomplicated postpartum course.  She is ambulating, tolerating a regular diet, passing flatus, and urinating well. Patient is discharged home in stable condition on 06/26/16.  Pt did have retained placenta requiring manual extraction and use of the sponge stick to remove all membranes  Physical exam Vitals:   06/25/16 0930 06/25/16 1600 06/25/16 1908 06/26/16 0550  BP: 112/63 119/69 110/63 126/68  Pulse: 63 66 70 64  Resp: 18 18 18 18   Temp: 98.9 F (37.2 C) 98.3 F (36.8 C) 98.5 F (36.9 C) 98.2 F (36.8 C)  TempSrc: Oral Oral Oral Oral  SpO2:   100%   Weight:      Height:       General: alert, cooperative and no distress Lochia: appropriate Uterine Fundus: firm Incision:  N/A DVT Evaluation: No evidence of DVT seen on physical exam. No significant calf/ankle edema. Labs: Lab Results  Component Value Date   WBC 23.0 (H) 06/25/2016   HGB 12.3 06/25/2016   HCT 35.5 (L) 06/25/2016   MCV 87.2 06/25/2016   PLT 194 06/25/2016   No flowsheet data found.  Discharge instruction: per After Visit Summary and "Baby and Me Booklet".  After visit meds:    Medication List    STOP taking these medications   calcium carbonate 500 MG chewable tablet Commonly known as:  TUMS - dosed in mg elemental calcium   oxymetazoline 0.05 % nasal spray Commonly known as:  AFRIN     TAKE these medications   acyclovir 400 MG tablet Commonly known as:  ZOVIRAX Take one tablet 3 times a day for 5 days during outbreak. What changed:  how much to take  how to take this  when to take this  additional instructions   PRENATAL VITAMINS PO Take 1 tablet by mouth daily.       Diet: routine diet  Activity: Advance as tolerated. Pelvic rest for 6 weeks.   Outpatient follow up:6 weeks Follow up Appt:No future appointments. Follow up Visit:No Follow-up on file.  Postpartum contraception: IUD undecided  Newborn Data: Live born female  Birth Weight: 7 lb (3175 g) APGAR: 8, 9  Baby Feeding: Breast Disposition:home with mother   06/26/2016 Ernestina PennaNicholas Jvion Turgeon, MD

## 2016-06-26 NOTE — Progress Notes (Signed)
POSTPARTUM PROGRESS NOTE  Post Partum Day 1 Subjective:  Jeanne Lynch is a 35 y.o. G1P1001 2763w2d s/p SVD with retained placenta manually extracted.  No acute events overnight.  Pt denies problems with ambulating, voiding or po intake.  She denies nausea or vomiting.  Pain is well controlled.  She has had flatus. She has not had bowel movement.  Lochia Minimal.   Objective: Blood pressure 126/68, pulse 64, temperature 98.2 F (36.8 C), temperature source Oral, resp. rate 18, height 5\' 10"  (1.778 m), weight 170 lb (77.1 kg), last menstrual period 09/12/2015, SpO2 100 %, unknown if currently breastfeeding.  Physical Exam:  General: alert, cooperative and no distress Lochia:normal flow Chest: no respiratory distress Heart:regular rate, distal pulses intact Abdomen: soft, nontender,  Uterine Fundus: firm, appropriately tender DVT Evaluation: No calf swelling or tenderness Extremities: trace edema   Recent Labs  06/25/16 0120 06/25/16 0552  HGB 13.0 12.3  HCT 36.7 35.5*    Assessment/Plan:  ASSESSMENT: Jeanne Lynch is a 35 y.o. G1P1001 4963w2d s/p SVD with manual extraction of placenta  Plan for discharge tomorrow and Breastfeeding   LOS: 2 days   Les Pouicholas SchenkMD 06/26/2016, 7:49 AM

## 2016-06-30 NOTE — H&P (Signed)
Jeanne Lynch is a 34 y.o. female G1 @ 40.1 wks presenting for labor. States contractions started at 2100, every 2-3 mins. Denies ROM, vag bleeding. GBS neg.  OB History    Gravida Para Term Preterm AB Living   1             SAB TAB Ectopic Multiple Live Births                 Past Medical History:  Diagnosis Date  . Migraines    Past Surgical History:  Procedure Laterality Date  . NO PAST SURGERIES    . WISDOM TOOTH EXTRACTION     Family History: family history is not on file. Social History:  reports that she has never smoked. She has never used smokeless tobacco. She reports that she does not drink alcohol or use drugs.     Maternal Diabetes: No Genetic Screening: Normal Maternal Ultrasounds/Referrals: Normal Fetal Ultrasounds or other Referrals:  None Maternal Substance Abuse:  No Significant Maternal Medications:  None Significant Maternal Lab Results:  None Other Comments:  None  Review of Systems  Constitutional: Negative.   HENT: Negative.   Eyes: Negative.   Respiratory: Negative.   Cardiovascular: Negative.   Gastrointestinal: Positive for abdominal pain.  Genitourinary: Negative.   Musculoskeletal: Positive for back pain.  Skin: Negative.   Neurological: Negative.   Endo/Heme/Allergies: Negative.   Psychiatric/Behavioral: Negative.    Maternal Medical History:  Reason for admission: Contractions.   Contractions: Onset was 3-5 hours ago.   Frequency: regular.   Duration is approximately 60 seconds.   Perceived severity is moderate.    Fetal activity: Perceived fetal activity is normal.   Last perceived fetal movement was within the past hour.    Prenatal complications: no prenatal complications Prenatal Complications - Diabetes: none.    Dilation: 3.5 Effacement (%): 100 Station: -2 Exam by:: K. WeissRN Blood pressure 126/83, pulse 65, temperature 98.3 F (36.8 C), resp. rate 16, height 5' 10" (1.778 m), weight 77.1 kg (170 lb), last  menstrual period 09/12/2015. Maternal Exam:  Uterine Assessment: Contraction strength is moderate.  Contraction frequency is regular.   Abdomen: Patient reports no abdominal tenderness. Estimated fetal weight is 7-0.   Fetal presentation: vertex  Introitus: Normal vulva. Normal vagina.  Ferning test: not done.  Nitrazine test: not done. Amniotic fluid character: not assessed.  Pelvis: adequate for delivery.   Cervix: Cervix evaluated by digital exam.     Fetal Exam Fetal Monitor Review: Mode: ultrasound.   Variability: moderate (6-25 bpm).   Pattern: accelerations present.    Fetal State Assessment: Category I - tracings are normal.     Physical Exam  Constitutional: She is oriented to person, place, and time. She appears well-developed and well-nourished.  HENT:  Head: Normocephalic.  Eyes: Pupils are equal, round, and reactive to light.  Neck: Normal range of motion.  Cardiovascular: Normal rate, regular rhythm and normal heart sounds.   Respiratory: Effort normal and breath sounds normal.  GI: Soft. Bowel sounds are normal.  Genitourinary: Vagina normal and uterus normal.  Musculoskeletal: Normal range of motion.  Neurological: She is alert and oriented to person, place, and time. She has normal reflexes.  Skin: Skin is warm and dry.  Psychiatric: She has a normal mood and affect. Her behavior is normal. Judgment and thought content normal.    Prenatal labs: ABO, Rh: A/POS/-- (01/20 1002) Antibody: NEG (01/20 1002) Rubella: 7.83 (01/20 1002) RPR: NON REAC (05/26 1130)  HBsAg:   NEGATIVE (01/20 1002)  HIV: NONREACTIVE (05/26 1130)  GBS:     Assessment/Plan:  SVE 3.5/100/-2. GBS neg.  Admit to labor and delivery.  Ashley Ellis 06/24/2016, 11:09 PM    

## 2016-07-06 ENCOUNTER — Telehealth: Payer: Self-pay | Admitting: Advanced Practice Midwife

## 2016-07-06 NOTE — Telephone Encounter (Signed)
Pt is 9 days postpartum SVD and called reporting passing one quarter-sized clot this morning. No significant bleeding following that. Wants to know if she should come in for eval. Denies fever, chills, abd pain. Bleeding lighter than a period. CNM reassured her that this does not sound worrisome. Instructed to monitor bleeding and call office if Sx worsen or go to [facility] for emergencies (heavy bleeding, fever, severe abd pain.). Pt verbalizes understanding.

## 2016-08-03 ENCOUNTER — Encounter: Payer: Self-pay | Admitting: Family

## 2016-08-03 ENCOUNTER — Ambulatory Visit (INDEPENDENT_AMBULATORY_CARE_PROVIDER_SITE_OTHER): Payer: Commercial Managed Care - HMO | Admitting: Family

## 2016-08-03 VITALS — BP 119/68 | HR 80 | Resp 16 | Ht 72.0 in | Wt 151.0 lb

## 2016-08-03 DIAGNOSIS — N644 Mastodynia: Secondary | ICD-10-CM

## 2016-08-03 MED ORDER — CEPHALEXIN 500 MG PO CAPS: 500.0000 mg | ORAL_CAPSULE | Freq: Four times a day (QID) | ORAL | 0 refills | Status: DC

## 2016-08-03 NOTE — Progress Notes (Signed)
Post Partum Exam  Jeanne Lynch is a 35 y.o. 201P1001 female who presents for a postpartum visit. She is 6 weeks postpartum following a spontaneous vaginal delivery. I have fully reviewed the prenatal and intrapartum course. The delivery was at 40w 2d gestational weeks.  Anesthesia: laughing gas.. Postpartum course has been unremarkable. Baby's course has been unremarkable. Baby is feeding by breast. Bleeding no bleeding. Bowel function is normal. Bladder function is normal. Patient is not sexually active. Contraception method is none.  Plans to use condoms with partner obtaining vasectomy.  If vasectomy delayed will get IUD.   [place]  Desires RX for Keflex due to experiencing past symptoms of mastitis that were abated with rest and ibuprofen.  Postpartum depression screening:neg  The following portions of the patient's history were reviewed and updated as appropriate: allergies, current medications, past family history, past medical history, past social history, past surgical history and problem list.  Review of Systems Pertinent items are noted in HPI.   Objective:    BP 116/78 mmHg  Pulse 78  Resp 16  Ht 5\' 5"  (1.651 m)  Wt 211 lb (95.709 kg)  BMI 35.11 kg/m2  Breastfeeding? Yes  General:  alert, cooperative and appears stated age   Breasts:  inspection negative, no nipple discharge or bleeding, no masses or nodularity palpable  Lungs: clear to auscultation bilaterally  Heart:  regular rate and rhythm, S1, S2 normal, no murmur, click, rub or gallop  Abdomen: soft, non-tender; bowel sounds normal; no masses,  no organomegaly   Vulva:  normal  Vagina: normal vagina, no discharge, exudate, lesion, or erythema; areas healed with irregular approximation, no signs of infection  Cervix:  no cervical motion tenderness  Corpus: normal size, contour, position, consistency, mobility, non-tender  Adnexa:  normal adnexa  Rectal Exam: Not performed.        Assessment:     Normal postpartum exam. Pap smear not done at today's visit.   Plan:    1. Contraception: condoms 2. RX Keflex  3. Follow up as needed.   Eino FarberWalidah Kennith GainN Karim, CNM

## 2017-01-01 ENCOUNTER — Ambulatory Visit (INDEPENDENT_AMBULATORY_CARE_PROVIDER_SITE_OTHER): Payer: Commercial Managed Care - HMO | Admitting: Sports Medicine

## 2017-01-01 ENCOUNTER — Encounter: Payer: Self-pay | Admitting: Sports Medicine

## 2017-01-01 ENCOUNTER — Ambulatory Visit (INDEPENDENT_AMBULATORY_CARE_PROVIDER_SITE_OTHER): Payer: Commercial Managed Care - HMO

## 2017-01-01 VITALS — BP 110/72 | HR 82 | Wt 150.5 lb

## 2017-01-01 DIAGNOSIS — M25511 Pain in right shoulder: Secondary | ICD-10-CM

## 2017-01-01 DIAGNOSIS — M75101 Unspecified rotator cuff tear or rupture of right shoulder, not specified as traumatic: Secondary | ICD-10-CM | POA: Diagnosis not present

## 2017-01-01 DIAGNOSIS — Z23 Encounter for immunization: Secondary | ICD-10-CM | POA: Diagnosis not present

## 2017-01-01 MED ORDER — MELOXICAM 15 MG PO TABS: ORAL_TABLET | ORAL | 3 refills | Status: DC

## 2017-01-01 NOTE — Assessment & Plan Note (Signed)
Physical therapy, x-rays, meloxicam. Return in 4 weeks, injection if no better.

## 2017-01-01 NOTE — Progress Notes (Signed)
   Subjective:    I'm seeing this patient as a consultation for:  Dr. Elsie LincolnKelly Leggett  CC: Right shoulder pain  HPI: This is a pleasant 36 year old female, she is 6 months postpartum. Still breast-feeding. For the past several weeks she's had increasing pain that she localizes in the axilla as well as the anterior right shoulder, worse with reaching forward and overhead activities, pain is moderate, persistent without radiation. No mechanical symptoms, no trauma, no paresthesias into the fingers and neck pain.  She does need to establish care with a medical provider.  Past medical history:  Negative.  See flowsheet/record as well for more information.  Surgical history: Negative.  See flowsheet/record as well for more information.  Family history: Negative.  See flowsheet/record as well for more information.  Social history: Negative.  See flowsheet/record as well for more information.  Allergies, and medications have been entered into the medical record, reviewed, and no changes needed.   Review of Systems: No headache, visual changes, nausea, vomiting, diarrhea, constipation, dizziness, abdominal pain, skin rash, fevers, chills, night sweats, weight loss, swollen lymph nodes, body aches, joint swelling, muscle aches, chest pain, shortness of breath, mood changes, visual or auditory hallucinations.   Objective:   General: Well Developed, well nourished, and in no acute distress.  Neuro/Psych: Alert and oriented x3, extra-ocular muscles intact, able to move all 4 extremities, sensation grossly intact. Skin: Warm and dry, no rashes noted.  Respiratory: Not using accessory muscles, speaking in full sentences, trachea midline.  Cardiovascular: Pulses palpable, no extremity edema. Abdomen: Does not appear distended. Right Shoulder: Inspection reveals no abnormalities, atrophy or asymmetry. Palpation is normal with no tenderness over AC joint or bicipital groove. ROM is full in all  planes. Rotator cuff strength normal throughout. Positive Neer and Hawkin's tests, empty can. Speeds and Yergason's tests normal. No labral pathology noted with negative Obrien's, negative crank, negative clunk, and good stability. Normal scapular function observed. No painful arc and no drop arm sign. No apprehension sign  Impression and Recommendations:   This case required medical decision making of moderate complexity.  Rotator cuff syndrome, right Physical therapy, x-rays, meloxicam. Return in 4 weeks, injection if no better.

## 2017-01-02 ENCOUNTER — Ambulatory Visit (INDEPENDENT_AMBULATORY_CARE_PROVIDER_SITE_OTHER): Payer: Commercial Managed Care - HMO | Admitting: Physician Assistant

## 2017-01-02 ENCOUNTER — Encounter: Payer: Self-pay | Admitting: Physician Assistant

## 2017-01-02 VITALS — BP 121/76 | HR 73 | Ht 70.0 in | Wt 151.0 lb

## 2017-01-02 DIAGNOSIS — R0600 Dyspnea, unspecified: Secondary | ICD-10-CM | POA: Insufficient documentation

## 2017-01-02 DIAGNOSIS — R0689 Other abnormalities of breathing: Secondary | ICD-10-CM

## 2017-01-02 DIAGNOSIS — Z0000 Encounter for general adult medical examination without abnormal findings: Secondary | ICD-10-CM

## 2017-01-02 DIAGNOSIS — L821 Other seborrheic keratosis: Secondary | ICD-10-CM | POA: Diagnosis not present

## 2017-01-02 DIAGNOSIS — A6000 Herpesviral infection of urogenital system, unspecified: Secondary | ICD-10-CM | POA: Insufficient documentation

## 2017-01-02 DIAGNOSIS — L918 Other hypertrophic disorders of the skin: Secondary | ICD-10-CM | POA: Diagnosis not present

## 2017-01-02 LAB — CBC
HEMATOCRIT: 41.3 % (ref 35.0–45.0)
HEMOGLOBIN: 13.5 g/dL (ref 11.7–15.5)
MCH: 28.6 pg (ref 27.0–33.0)
MCHC: 32.7 g/dL (ref 32.0–36.0)
MCV: 87.5 fL (ref 80.0–100.0)
MPV: 9.7 fL (ref 7.5–12.5)
Platelets: 278 10*3/uL (ref 140–400)
RBC: 4.72 MIL/uL (ref 3.80–5.10)
RDW: 12.7 % (ref 11.0–15.0)
WBC: 5.7 10*3/uL (ref 3.8–10.8)

## 2017-01-02 LAB — COMPREHENSIVE METABOLIC PANEL
ALT: 15 U/L (ref 6–29)
AST: 16 U/L (ref 10–30)
Albumin: 4.3 g/dL (ref 3.6–5.1)
Alkaline Phosphatase: 50 U/L (ref 33–115)
BILIRUBIN TOTAL: 0.5 mg/dL (ref 0.2–1.2)
BUN: 14 mg/dL (ref 7–25)
CALCIUM: 9.4 mg/dL (ref 8.6–10.2)
CO2: 21 mmol/L (ref 20–31)
CREATININE: 0.59 mg/dL (ref 0.50–1.10)
Chloride: 104 mmol/L (ref 98–110)
GLUCOSE: 79 mg/dL (ref 65–99)
Potassium: 4.4 mmol/L (ref 3.5–5.3)
Sodium: 139 mmol/L (ref 135–146)
Total Protein: 7 g/dL (ref 6.1–8.1)

## 2017-01-02 LAB — LIPID PANEL W/REFLEX DIRECT LDL
Cholesterol: 225 mg/dL — ABNORMAL HIGH (ref <200)
HDL: 115 mg/dL (ref >50)
LDL-Cholesterol: 96 mg/dL
Non-HDL Cholesterol (Calc): 110 mg/dL (ref <130)
Total CHOL/HDL Ratio: 2 Ratio (ref <5.0)
Triglycerides: 55 mg/dL (ref <150)

## 2017-01-02 LAB — TSH: TSH: 1.67 m[IU]/L

## 2017-01-02 NOTE — Patient Instructions (Signed)
Physical Activity Recommendations for modifying lipids and lowering blood pressure Engage in aerobic physical activity to reduce LDL-cholesterol, non-HDL-cholesterol, and blood pressure  Frequency: 3-4 sessions per week  Intensity: moderate to vigorous  Duration: 40 minutes on average  Physical Activity Recommendations for secondary prevention 1. Aerobic exercise  Frequency: 3-5 sessions per week  Intensity: 50-80% capacity  Duration: 20 - 60 minutes  Examples: walking, treadmill, cycling, rowing, stair climbing, and arm/leg ergometry  2. Resistance exercise  Frequency: 2-3 sessions per week  Intensity: 10-15 repetitions/set to moderate fatigue  Duration: 1-3 sets of 8-10 upper and lower body exercises  Examples: calisthenics, elastic bands, cuff/hand weights, dumbbels, free weights, wall pulleys, and weight machines  Heart-Healthy Lifestyle  Eating a diet rich in vegetables, fruits and whole grains: also includes low-fat dairy products, poultry, fish, legumes, and nuts; limit intake of sweets, sugar-sweetened beverages and red meats  Getting regular exercise  Maintaining a healthy weight  Not smoking or getting help quitting  Staying on top of your health; for some people, lifestyle changes alone may not be enough to prevent a heart attack or stroke. In these cases, taking a statin at the right dose will most likely be necessary     Preventive Care 18-39 Years, Female Preventive care refers to lifestyle choices and visits with your health care provider that can promote health and wellness. What does preventive care include?  A yearly physical exam. This is also called an annual well check.  Dental exams once or twice a year.  Routine eye exams. Ask your health care provider how often you should have your eyes checked.  Personal lifestyle choices, including: ? Daily care of your teeth and gums. ? Regular physical activity. ? Eating a healthy diet. ? Avoiding  tobacco and drug use. ? Limiting alcohol use. ? Practicing safe sex. ? Taking vitamin and mineral supplements as recommended by your health care provider. What happens during an annual well check? The services and screenings done by your health care provider during your annual well check will depend on your age, overall health, lifestyle risk factors, and family history of disease. Counseling Your health care provider may ask you questions about your:  Alcohol use.  Tobacco use.  Drug use.  Emotional well-being.  Home and relationship well-being.  Sexual activity.  Eating habits.  Work and work environment.  Method of birth control.  Menstrual cycle.  Pregnancy history.  Screening You may have the following tests or measurements:  Height, weight, and BMI.  Diabetes screening. This is done by checking your blood sugar (glucose) after you have not eaten for a while (fasting).  Blood pressure.  Lipid and cholesterol levels. These may be checked every 5 years starting at age 20.  Skin check.  Hepatitis C blood test.  Hepatitis B blood test.  Sexually transmitted disease (STD) testing.  BRCA-related cancer screening. This may be done if you have a family history of breast, ovarian, tubal, or peritoneal cancers.  Pelvic exam and Pap test. This may be done every 3 years starting at age 21. Starting at age 30, this may be done every 5 years if you have a Pap test in combination with an HPV test.  Discuss your test results, treatment options, and if necessary, the need for more tests with your health care provider. Vaccines Your health care provider may recommend certain vaccines, such as:  Influenza vaccine. This is recommended every year.  Tetanus, diphtheria, and acellular pertussis (Tdap, Td) vaccine. You may   need a Td booster every 10 years.  Varicella vaccine. You may need this if you have not been vaccinated.  HPV vaccine. If you are 26 or younger, you  may need three doses over 6 months.  Measles, mumps, and rubella (MMR) vaccine. You may need at least one dose of MMR. You may also need a second dose.  Pneumococcal 13-valent conjugate (PCV13) vaccine. You may need this if you have certain conditions and were not previously vaccinated.  Pneumococcal polysaccharide (PPSV23) vaccine. You may need one or two doses if you smoke cigarettes or if you have certain conditions.  Meningococcal vaccine. One dose is recommended if you are age 19-21 years and a first-year college student living in a residence hall, or if you have one of several medical conditions. You may also need additional booster doses.  Hepatitis A vaccine. You may need this if you have certain conditions or if you travel or work in places where you may be exposed to hepatitis A.  Hepatitis B vaccine. You may need this if you have certain conditions or if you travel or work in places where you may be exposed to hepatitis B.  Haemophilus influenzae type b (Hib) vaccine. You may need this if you have certain risk factors.  Talk to your health care provider about which screenings and vaccines you need and how often you need them. This information is not intended to replace advice given to you by your health care provider. Make sure you discuss any questions you have with your health care provider. Document Released: 12/18/2001 Document Revised: 07/11/2016 Document Reviewed: 08/23/2015 Elsevier Interactive Patient Education  2017 [employer]  

## 2017-01-02 NOTE — Progress Notes (Signed)
HPI:                                                                Marlow BaarsCorinna Mckenna is a 36 y.o. female [facility] today to establish care  Current Concerns include: irregular moles  Patient is 11 months postpartum and currently breastfeeding. She denies postpartum blues. She states she had some elevated blood pressures in her third trimester, but denies preeclampsia. Denies history gestational diabetes.   Patient states she has some moles that grew during pregnancy that are sometimes dry and itchy. Denies family or personal history of skin cancer. She would like to have them examined and if possible removed.  Patient also endorses a history of poor exercise tolerance and dyspnea with exertion. She states she has a "concave chest" and had pulmonary function tests that showed she had the lung function of a "36 year old." She denies chest pain, hemoptysis, wheezing and cough.  Health Maintenance Health Maintenance  Topic Date Due  . PAP SMEAR  11/24/2018  . TETANUS/TDAP  03/30/2026  . INFLUENZA VACCINE  Completed  . HIV Screening  Completed    GYN/Sexual Health  Menstrual status: breastfeeding  Last pap smear: 2017  History of abnormal pap smears: no  Sexually active: yes  Current contraception: condoms, scheduled IUD insertion next week  Health Habits  Diet: good  Exercise: no  ETOH: 5 drinks per week  Tobacco: no, former quit >5 years ago  Drugs: no  Past Medical History:  Diagnosis Date  . Migraines    Past Surgical History:  Procedure Laterality Date  . NO PAST SURGERIES    . WISDOM TOOTH EXTRACTION     Social History  Substance Use Topics  . Smoking status: Never Smoker  . Smokeless tobacco: Never Used  . Alcohol use No   family history includes Hypertension in her father and mother; Prostate cancer in her maternal grandfather and paternal grandfather; Renal cancer in her  father.  ROS: negative except as noted in the HPI  Medications: Current Outpatient Prescriptions  Medication Sig Dispense Refill  . ibuprofen (ADVIL,MOTRIN) 200 MG tablet Take 200 mg by mouth every 6 (six) hours as needed.    . Prenatal Multivit-Min-Fe-FA (PRENATAL VITAMINS PO) Take 1 tablet by mouth daily.    . cephALEXin (KEFLEX) 500 MG capsule Take 1 capsule (500 mg total) by mouth 4 (four) times daily. (Patient not taking: Reported on 01/01/2017) 40 capsule 0  . meloxicam (MOBIC) 15 MG tablet One tab PO qAM with breakfast for 2 weeks, then daily prn pain. (Patient not taking: Reported on 01/02/2017) 30 tablet 3   No current facility-administered medications for this visit.    Allergies  Allergen Reactions  . Sudafed [Pseudoephedrine Hcl] Rash       Objective:  BP 121/76   Pulse 73   Ht 5\' 10"  (1.778 m)   Wt 151 lb (68.5 kg)   Breastfeeding? Yes   BMI 21.67 kg/m  Gen: well-groomed, cooperative, not ill-appearing, no distress HEENT: normal conjunctiva, wearing contact lenses, TM's clear, oropharynx clear, moist mucus membranes, no thyromegaly or tenderness Pulm: Normal work of breathing, clear to auscultation bilaterally CV: Normal rate, regular rhythm, s1 and s2 distinct, no murmurs, clicks  or rubs appreciated on this exam, no carotid bruit GI: soft, nondistended, nontender, no masses Neuro: alert and oriented x 3, EOM's intact, PERRLA, DTR's intact MSK: strength 5/5 and symmetric, normal gait, distal pulses intact, no peripheral edema Skin: warm and dry, no rashes, multiple benign appearing seborrheic keratoses and skin tags on trunk Psych: normal affect, pleasant mood, normal speech and thought content   No results found for this or any previous visit (from the past 72 hour(s)). Dg Shoulder Right  Result Date: 01/01/2017 CLINICAL DATA:  Two days of right shoulder pain with no known injury EXAM: RIGHT SHOULDER - 2+ VIEW COMPARISON:  None in PACs FINDINGS: The bones of  the right shoulder are subjectively adequately mineralized. The glenohumeral and AC joint spaces are preserved. The subacromial subdeltoid space appears normal. No acute or healing fracture or lytic or blastic lesion is observed. The observed portions of the low right clavicle and upper right ribs are normal. IMPRESSION: There is no acute bony abnormality of the right shoulder. Electronically Signed   By: David  [place] M.D.   On: 01/01/2017 11:55   Depression screen PHQ 2/9 01/02/2017  Decreased Interest 0  Down, Depressed, Hopeless 0  PHQ - 2 Score 0       Assessment and Plan: 36 y.o. female with   1. Encounter for preventative adult health care examination - CBC - Comprehensive metabolic panel - Lipid Panel w/reflex Direct LDL - TSH  2. Dyspnea and respiratory abnormalities - Alpha-1-antitrypsin  3. Seborrheic keratoses, Multiple acquired skin tags Procedure: Cryodestruction of 14 lesions of the anterior and posterior trunk Consent obtained and verified. Time-out conducted. Noted no overlying erythema, induration, or other signs of local infection. Completed without difficulty using Cryo-Gun. Advised to call if fevers/chills, erythema, induration, drainage, or persistent bleeding.  Patient education and anticipatory guidance given Patient agrees with treatment plan Follow-up in 1 year or sooner as needed  Levonne Hubert PA-C

## 2017-01-03 ENCOUNTER — Encounter: Payer: Self-pay | Admitting: Physician Assistant

## 2017-01-04 LAB — ALPHA-1-ANTITRYPSIN: A-1 Antitrypsin, Ser: 99 mg/dL (ref 83–199)

## 2017-01-07 ENCOUNTER — Encounter: Payer: Self-pay | Admitting: Certified Nurse Midwife

## 2017-01-07 ENCOUNTER — Ambulatory Visit (INDEPENDENT_AMBULATORY_CARE_PROVIDER_SITE_OTHER): Payer: Commercial Managed Care - HMO | Admitting: Certified Nurse Midwife

## 2017-01-07 VITALS — BP 118/73 | HR 83 | Resp 16 | Ht 72.0 in | Wt 150.0 lb

## 2017-01-07 DIAGNOSIS — Z3202 Encounter for pregnancy test, result negative: Secondary | ICD-10-CM

## 2017-01-07 DIAGNOSIS — Z30430 Encounter for insertion of intrauterine contraceptive device: Secondary | ICD-10-CM | POA: Diagnosis not present

## 2017-01-07 LAB — POCT URINE PREGNANCY: PREG TEST UR: NEGATIVE

## 2017-01-07 MED ORDER — LEVONORGESTREL 20 MCG/24HR IU IUD
INTRAUTERINE_SYSTEM | Freq: Once | INTRAUTERINE | Status: AC
  Administered 2017-01-07: 11:00:00 via INTRAUTERINE

## 2017-01-07 NOTE — Progress Notes (Signed)
neg

## 2017-01-07 NOTE — Progress Notes (Addendum)
Subjective:     Jeanne Lynch is a 36 y.o. female here for a IUD insertion.  She is currently contracepting with condoms consistently and EBF. She is amenorrheic. Current complaints: none.    Gynecologic History No LMP recorded. Patient is not currently having periods (Reason: Lactating). Contraception: condoms Last Pap: 11/25/15. Results were: normal Last mammogram: n/a.   Obstetric History OB History  Gravida Para Term Preterm AB Living  1 1 1     1   SAB TAB Ectopic Multiple Live Births        0 1    # Outcome Date GA Lbr Len/2nd Weight Sex Delivery Anes PTL Lv  1 Term 06/25/16 4751w2d 10:40 / 00:47 7 lb (3.175 kg) F Vag-Spont Pud  LIV       The following portions of the patient's history were reviewed and updated as appropriate: allergies, current medications, past family history, past medical history, past social history, past surgical history and problem list.  Review of Systems Pertinent items are noted in HPI.    Objective:    BP 118/73   Pulse 83   Resp 16   Ht 6' (1.829 m)   Wt 150 lb (68 kg)   Breastfeeding? Yes   BMI 20.34 kg/m   General Appearance:    Alert, cooperative, no distress, appears stated age  Head:    Normocephalic, without obvious abnormality, atraumatic  Eyes:      Ears:      Nose:     Throat:    Neck:     Back:       Lungs:     respirations unlabored, normal rate  Chest Wall:       Heart:    Normal rate  Breast Exam:      Abdomen:       Genitalia:    Normal female without lesion, discharge or tenderness  Rectal:    l  Extremities:   Extremities normal  Pulses:     Skin:   Warm, dry  Lymph nodes:     Neurologic:       UPT negative  Procedure Note: Pt consented for IUD insertion.  Pt placed in lithotomy position.  Speculum inserted and cervix cleaned with betadine solution.  Uterus sounded to 7 cm; Mirena IUD inserted without difficulty.  Strings cut at approximately 3 cm.  Speculum removed. Tolerated well.   Assessment:   Contraceptive counseling IUD insertion   Plan:    Follow up in: 1 month.for string check Continue condoms x1 week Ok for tampons and menstrual cup after 2 weeks Total encounter 15 minutes with >50 % time spent counseling

## 2017-01-29 ENCOUNTER — Ambulatory Visit: Payer: Commercial Managed Care - HMO | Admitting: Sports Medicine

## 2017-02-04 ENCOUNTER — Encounter: Payer: Self-pay | Admitting: Advanced Practice Midwife

## 2017-02-04 ENCOUNTER — Ambulatory Visit (INDEPENDENT_AMBULATORY_CARE_PROVIDER_SITE_OTHER): Payer: Commercial Managed Care - HMO | Admitting: Advanced Practice Midwife

## 2017-02-04 VITALS — BP 117/77 | HR 95 | Wt 146.0 lb

## 2017-02-04 DIAGNOSIS — Z975 Presence of (intrauterine) contraceptive device: Secondary | ICD-10-CM | POA: Insufficient documentation

## 2017-02-04 DIAGNOSIS — Z30431 Encounter for routine checking of intrauterine contraceptive device: Secondary | ICD-10-CM | POA: Diagnosis not present

## 2017-02-04 NOTE — Progress Notes (Signed)
   Subjective:    Patient ID: Jeanne Lynch, female    DOB: [birth year], 36 y.o.   MRN: [id number]  HPI Had IUD placed on 01/07/17 Doing well, no cramping   Review of Systems  Constitutional: Negative for chills and fever.  Respiratory: Negative for shortness of breath.   Genitourinary: Negative for difficulty urinating and pelvic pain.       Objective:   Physical Exam  Constitutional: She is oriented to person, place, and time. She appears well-developed and well-nourished. No distress.  HENT:  Head: Normocephalic.  Cardiovascular: Normal rate.   Pulmonary/Chest: Effort normal. No respiratory distress.  Abdominal: There is no tenderness. There is no rebound and no guarding.  Genitourinary:  Genitourinary Comments: IUD strings visible  Musculoskeletal: Normal range of motion.  Neurological: She is alert and oriented to person, place, and time.  Skin: Skin is warm.  Psychiatric: She has a normal mood and affect.          Assessment & Plan:  Reassured Follow up in one year or prn

## 2017-02-04 NOTE — Patient Instructions (Signed)

## 2017-02-08 IMAGING — US US MFM OB COMP +14 WKS
1 series · 14 of 28 positions shown · non-contrast
Comparison: none

[Series 1: us mfm ob comp +14 wks · 85 acquisitions, 14 frames shown]
[im 4/85]
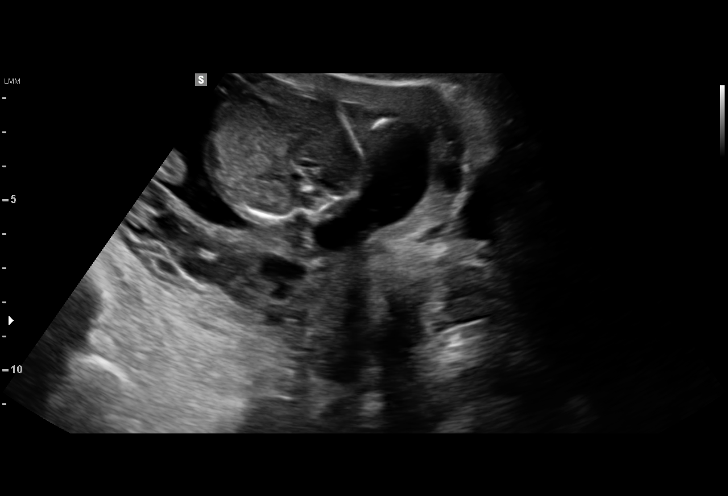
[im 10/85]
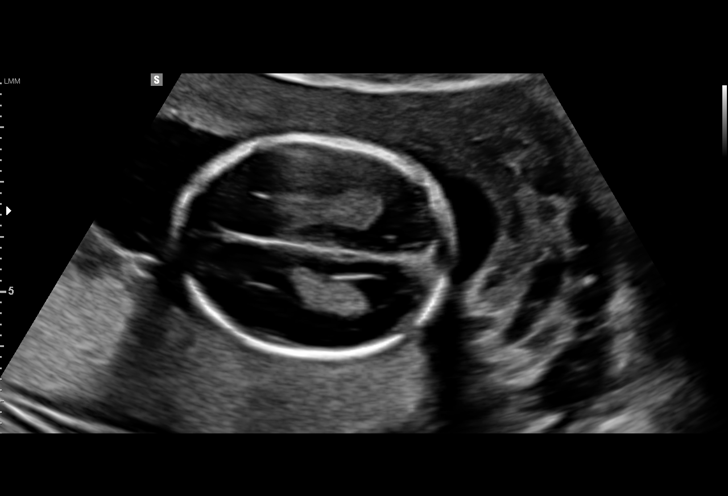
[im 16/85]
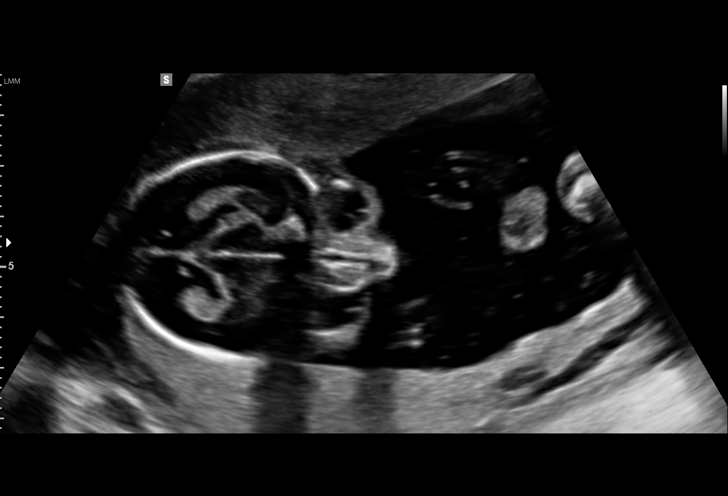
[im 22/85]
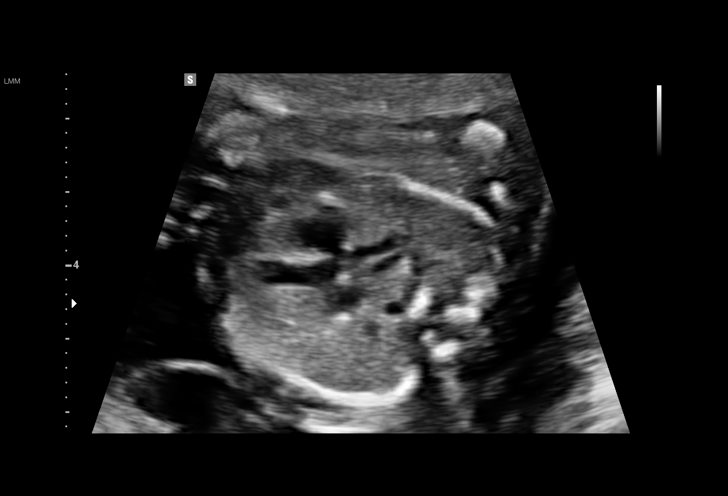
[im 29/85]
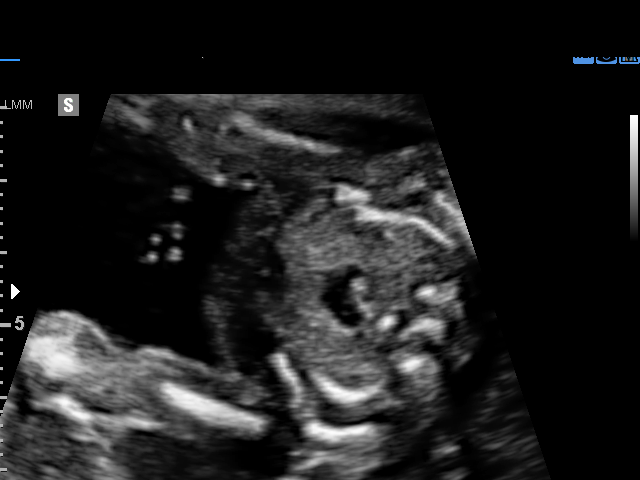
[im 35/85]
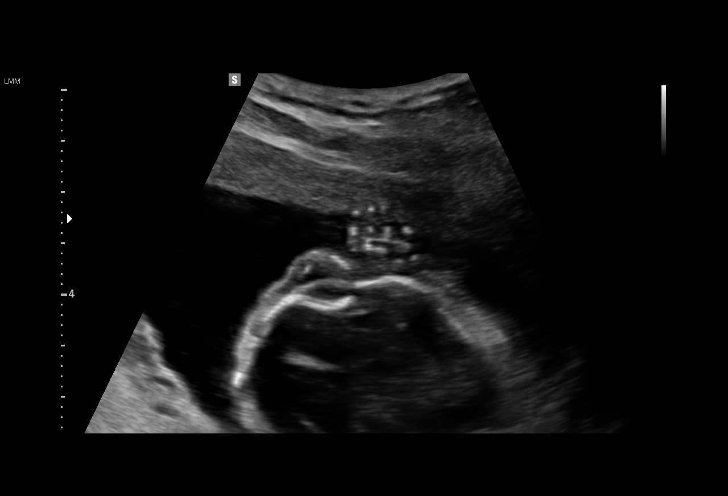
[im 41/85]
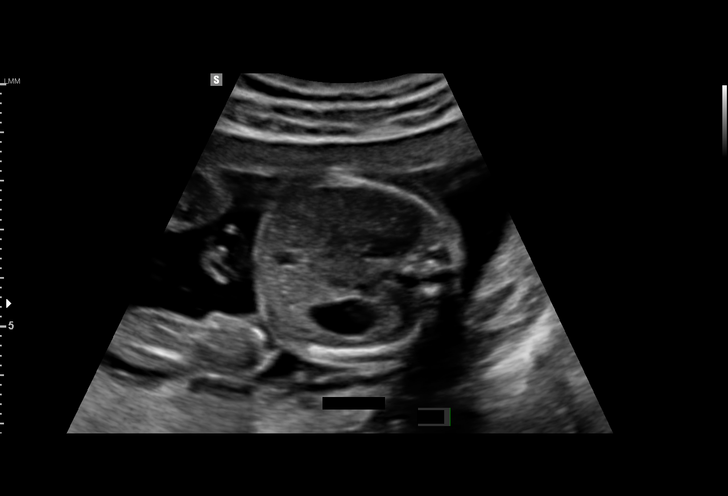
[im 47/85]
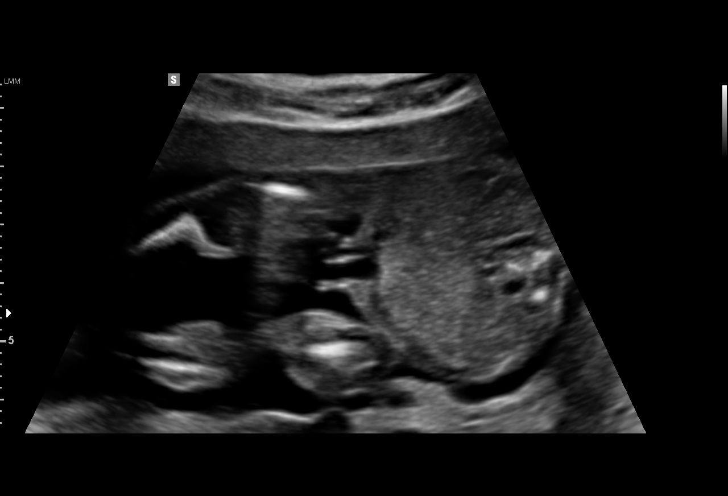
[im 53/85]
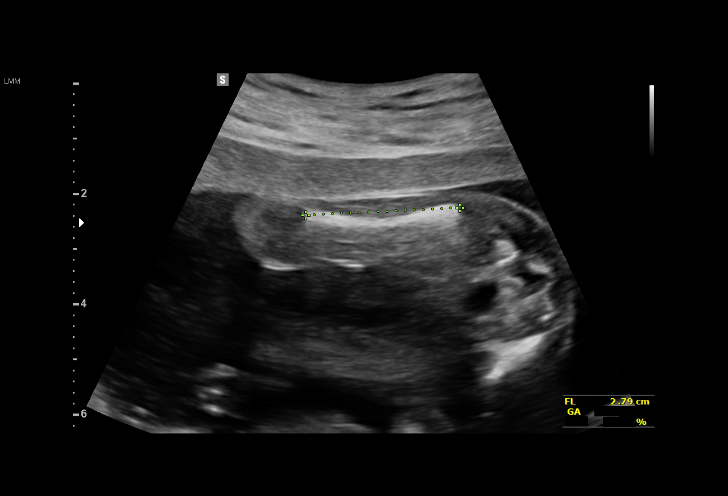
[im 60/85]
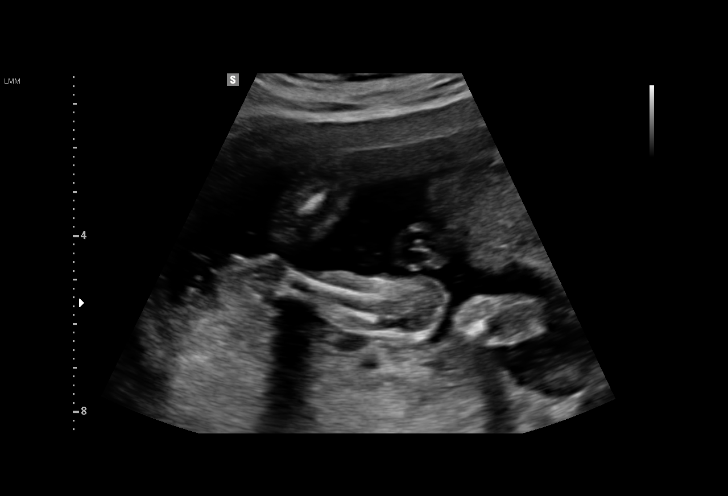
[im 66/85]
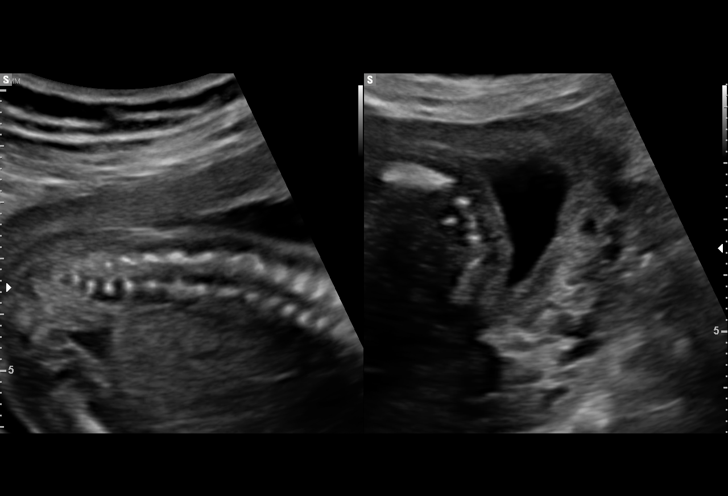
[im 72/85]
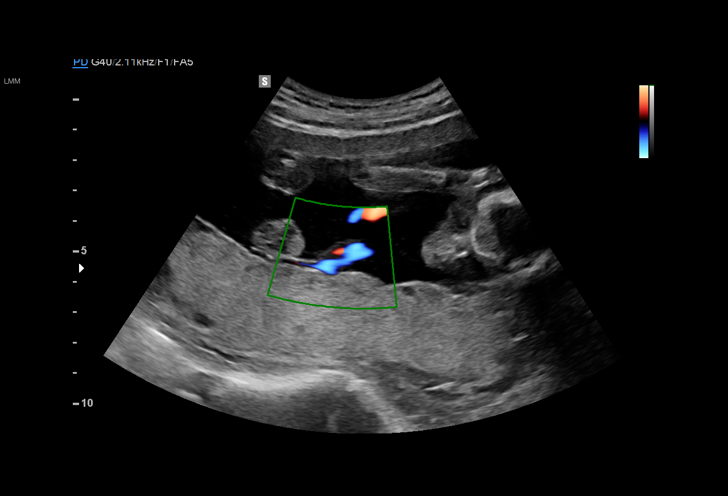
[im 78/85]
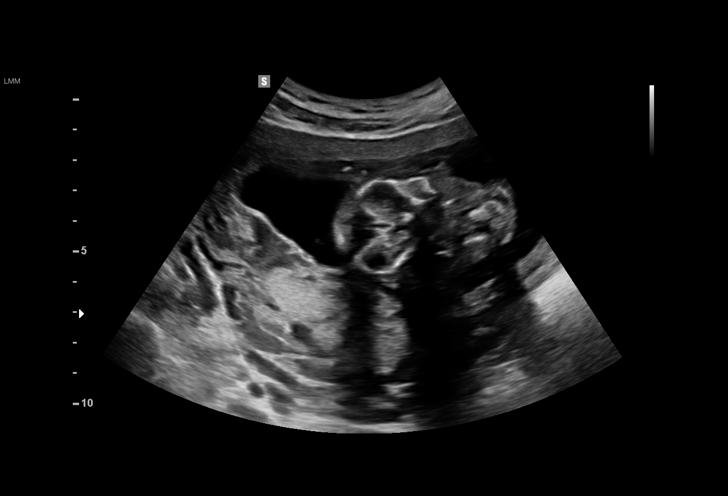
[im 85/85]
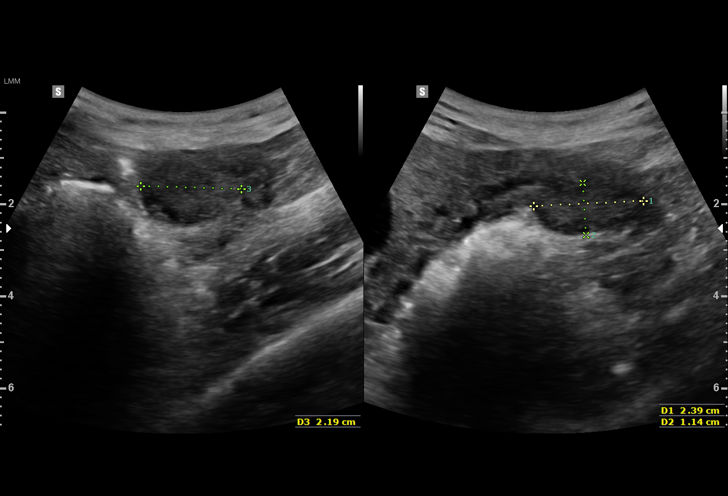

[14 of 28 positions shown; findings below may reference images not displayed]

[REDACTED]

1  KIEL DANCE            [id number]       [id number]     [id number]
Indications

18 weeks gestation of pregnancy; low risk
NIPS
Basic anatomic survey                          Z36
OB History

Gravidity:    1
Fetal Evaluation

Num Of Fetuses:     1
Fetal Heart         155
Rate(bpm):
Cardiac Activity:   Observed
Presentation:       Cephalic
Placenta:           Posterior, low-lying, 7 mm from int os
P. Cord Insertion:  Visualized, central

Amniotic Fluid
AFI FV:      Subjectively within normal limits
Larg Pckt:    5.1  cm
Biometry

BPD:      40.8  mm     G. Age:  18w 2d                  CI:         73.17  %    70 - 86
FL/HC:       18.3  %    16.1 -
HC:      151.6  mm     G. Age:  18w 1d         15  %    HC/AC:       1.09       1.09 -
AC:      139.1  mm     G. Age:  19w 2d         61  %    FL/BPD:      68.1  %
FL:       27.8  mm     G. Age:  18w 4d         31  %    FL/AC:       20.0  %    20 - 24
HUM:        26  mm     G. Age:  18w 1d         32  %
CER:      18.8  mm     G. Age:  18w 3d         36  %
CM:        2.5  mm

Est. FW:     260   gm     0 lb 9 oz     44  %
Gestational Age

U/S Today:     18w 4d                                        EDD:    06/25/16
Best:          18w 6d     Det. By:  Early Ultrasound         EDD:    06/23/16
(11/09/15)
Anatomy

Cranium:          Appears normal         LVOT:             Appears normal
Fetal Cavum:      Appears normal         Aortic Arch:      Appears normal
Ventricles:       Appears normal         Ductal Arch:      Appears normal
Choroid Plexus:   Appears normal         Diaphragm:        Appears normal
Cerebellum:       Appears normal         Stomach:          Appears normal, left
sided
Posterior Fossa:  Appears normal         Abdomen:          Appears normal
Nuchal Fold:      Appears normal         Abdominal Wall:   Appears nml (cord
insert, abd wall)
Face:             Appears normal         Cord Vessels:     Appears normal (3
(orbits and profile)                     vessel cord)
Lips:             Appears normal         Kidneys:          Appear normal
Palate:           Not well visualized    Bladder:          Appears normal
Fetal Thoracic:   Appears normal         Spine:            Appears normal
Heart:            Appears normal         Upper             Appears normal
(4CH, axis, and        Extremities:
situs)
RVOT:             Appears normal         Lower             Appears normal
Extremities:

Other:  Fetus appears to be a female. Heels and 5th digit appears normal.
Cervix Uterus Adnexa

Cervix
Length:            3.1  cm.
Normal appearance by transabdominal scan.

Uterus
No abnormality visualized.

Left Ovary
Within normal limits.

Right Ovary
Within normal limits.

Cul De Sac:   No free fluid seen.

Adnexa:       No abnormality visualized.
Impression

SIUP at 18+6 weeks
Normal detailed fetal anatomy
Markers of aneuploidy: none
Normal amniotic fluid volume
Measurements consistent with early US
Posterior low-lying placenta
Recommendations

Follow-up ultrasound in the early third trimester to reassess
placental location

## 2017-04-16 IMAGING — US US MFM OB TRANSVAGINAL
1 series · 14 of 28 positions shown · non-contrast
Comparison: none

[Series 1: us mfm ob transvaginal · 14 of 77 slices shown]
[im 3/77]
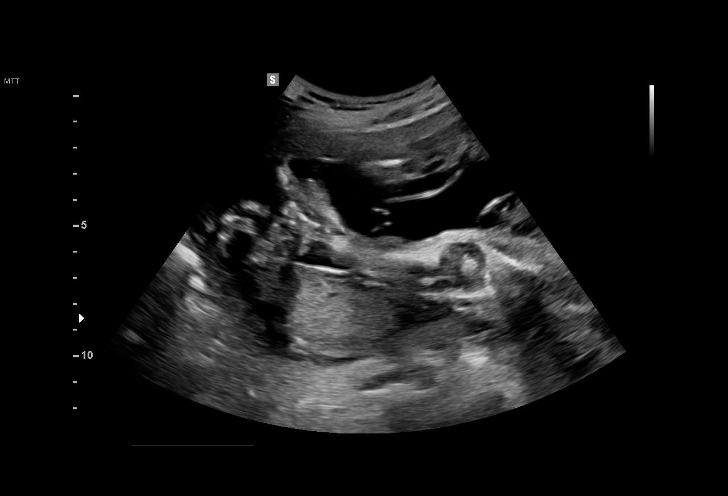
[im 9/77]
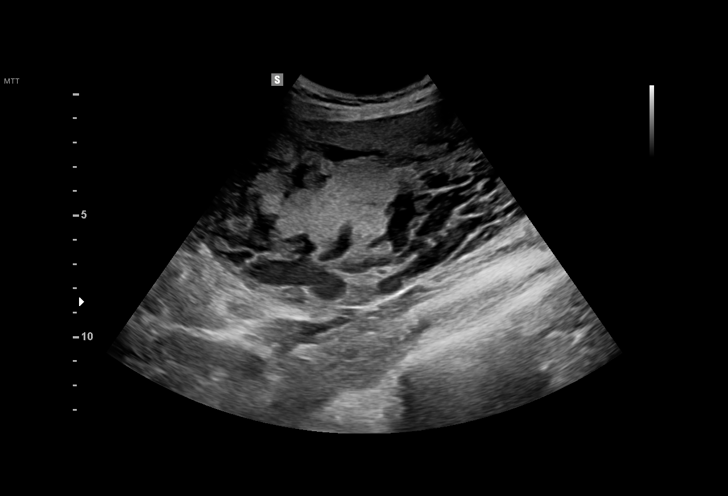
[im 15/77]
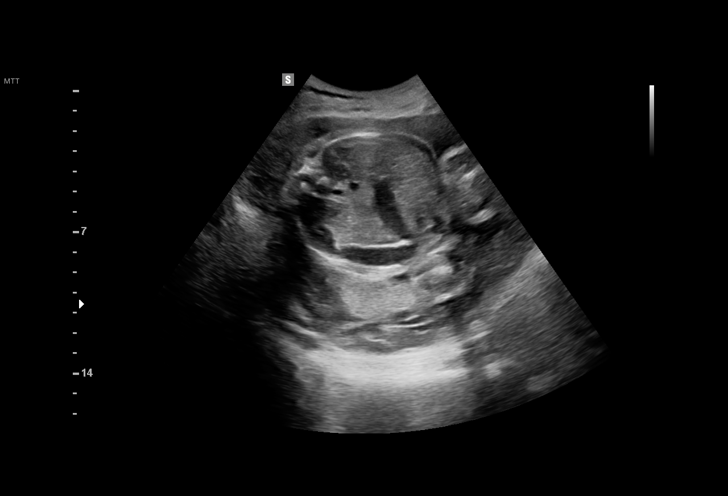
[im 20/77]
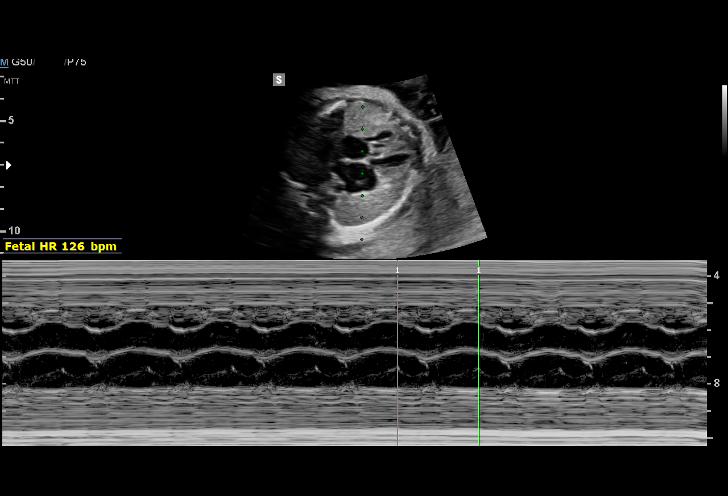
[im 26/77]
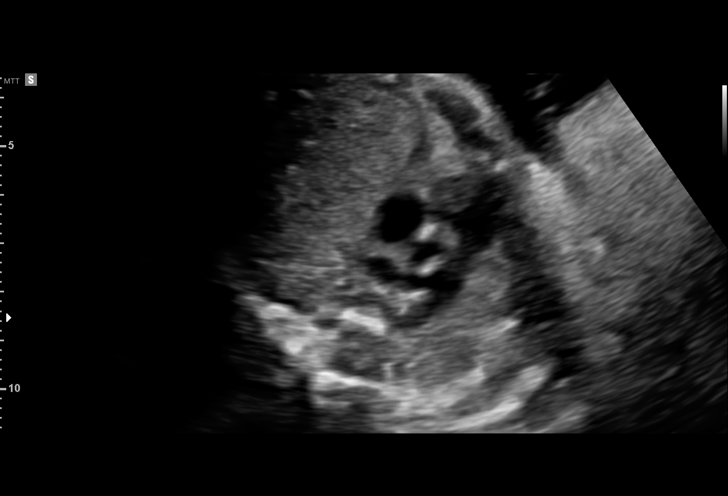
[im 31/77]
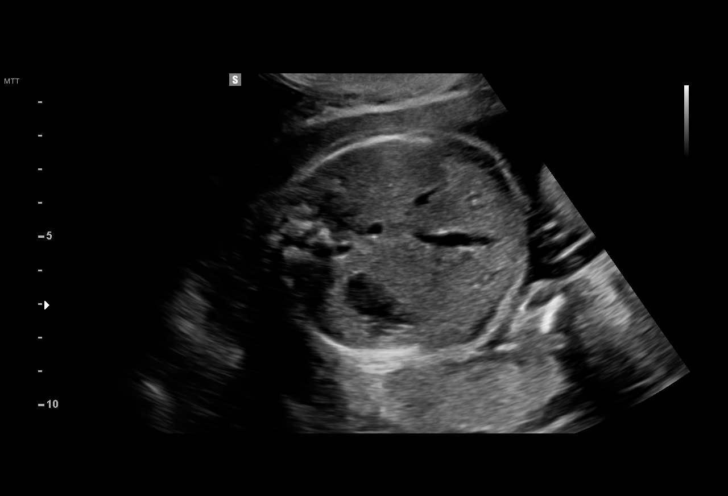
[im 37/77]
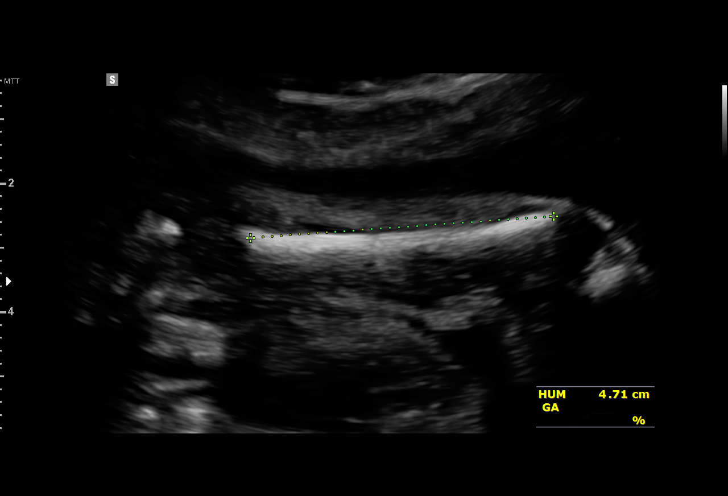
[im 43/77]
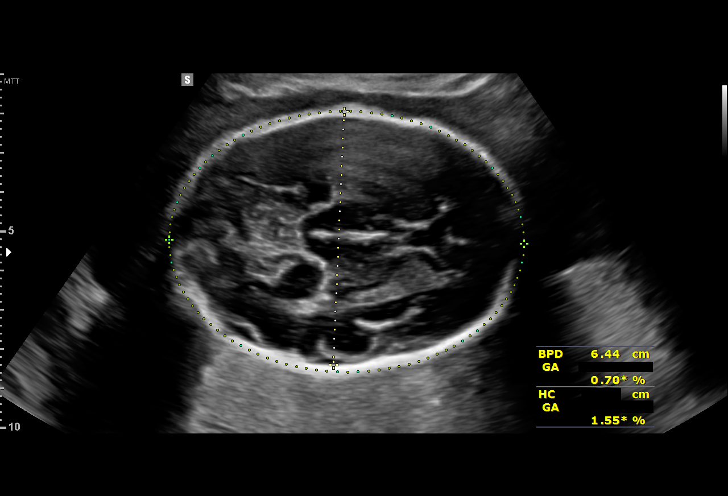
[im 48/77]
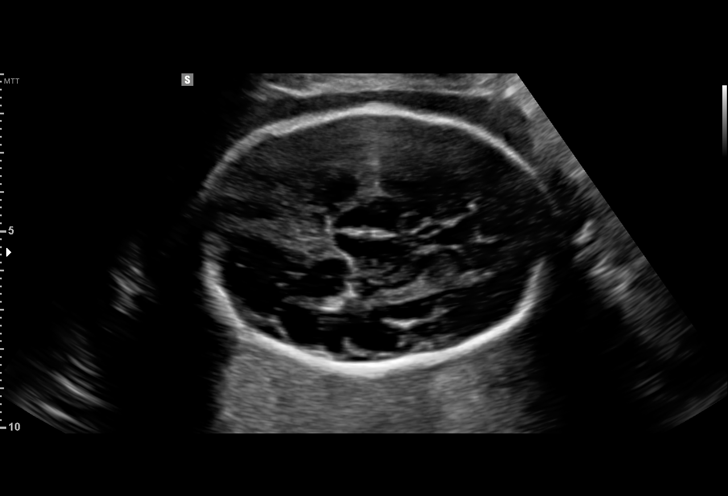
[im 54/77]
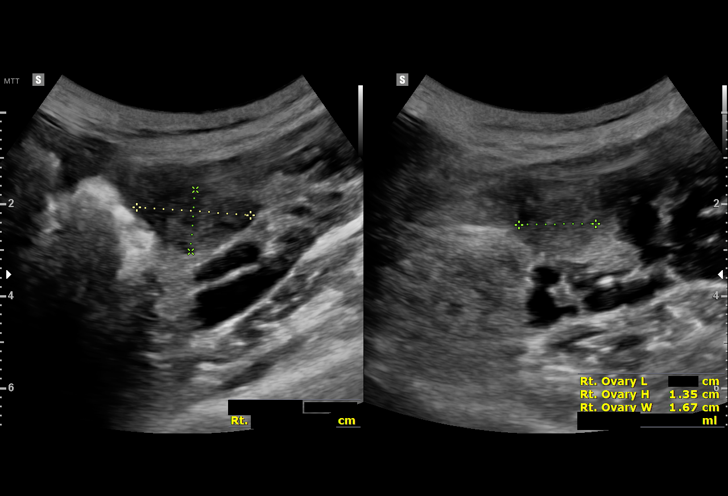
[im 60/77]
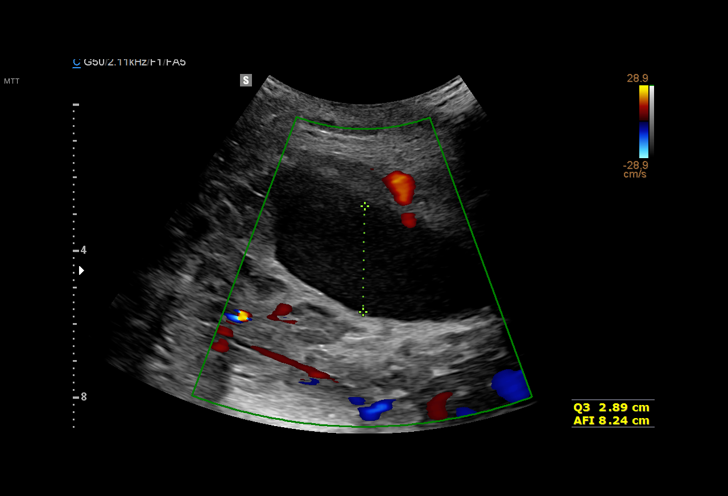
[im 65/77]
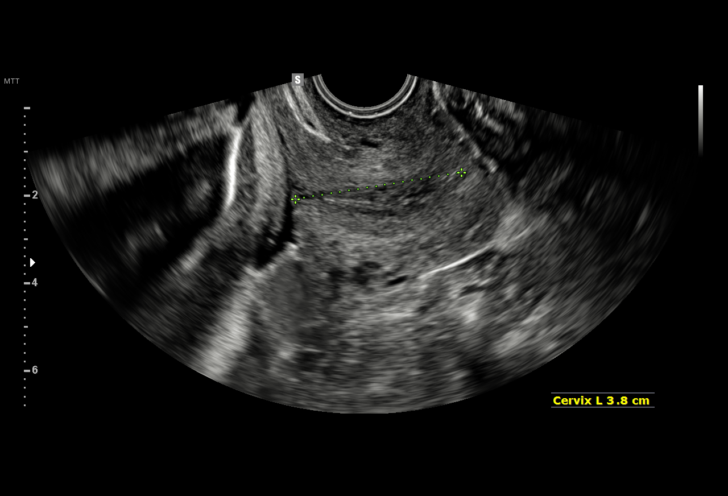
[im 71/77]
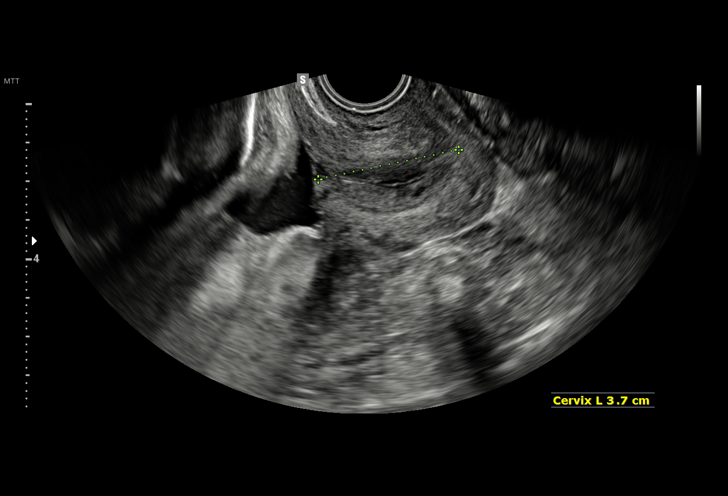
[im 77/77]
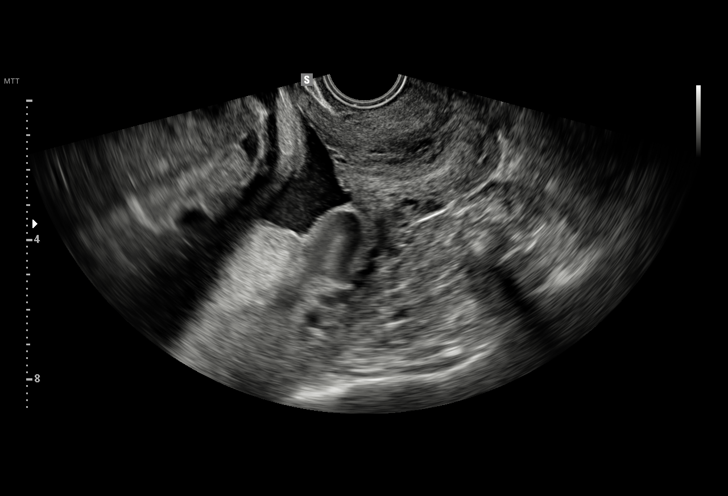

[14 of 28 positions shown; findings below may reference images not displayed]

[REDACTED]

1  NII KPAKPO MAKU            [id number]       [id number]     [id number]
2  NII KPAKPO MAKU            [id number]       [id number]     [id number]
Indications

28 weeks gestation of pregnancy
Low lying placenta, antepartum
OB History

Gravidity:    1
Fetal Evaluation

Num Of Fetuses:     1
Fetal Heart         126
Rate(bpm):
Cardiac Activity:   Observed
Presentation:       Cephalic
Placenta:           Posterior, low-lying, 1.0 cm from int os
P. Cord Insertion:  Visualized, central

Amniotic Fluid
AFI FV:      Subjectively low-normal

AFI Sum(cm)     %Tile       Largest Pocket(cm)
8.24            < 3

RUQ(cm)       RLQ(cm)       LUQ(cm)        LLQ(cm)
3.45          0
Biometry
BPD:      64.5  mm     G. Age:  26w 1d          1  %    CI:        68.18   %   70 - 86
FL/HC:      20.9   %   18.8 -
HC:      249.8  mm     G. Age:  27w 1d        < 3  %    HC/AC:      1.01       1.05 -
AC:      247.1  mm     G. Age:  28w 6d         58  %    FL/BPD:     81.1   %   71 - 87
FL:       52.3  mm     G. Age:  27w 6d         21  %    FL/AC:      21.2   %   20 - 24
HUM:      46.5  mm     G. Age:  27w 3d         23  %
CER:      33.8  mm     G. Age:  29w 3d         65  %

Est. FW:    4468  gm    2 lb 10 oz      46  %
Gestational Age

U/S Today:     27w 4d                                        EDD:   06/29/16
Best:          28w 3d    Det. By:   Early Ultrasound         EDD:   06/23/16
(11/09/15)
Anatomy

Cranium:               Appears normal         LVOT:                   Appears normal
Cavum:                 Appears normal         Aortic Arch:            Previously seen
Ventricles:            Appears normal         Ductal Arch:            Previously seen
Choroid Plexus:        Previously seen        Diaphragm:              Appears normal
Cerebellum:            Appears normal         Stomach:                Appears normal, left
sided
Posterior Fossa:       Previously seen        Abdomen:                Previously seen
Nuchal Fold:           Previously seen        Abdominal Wall:         Previously seen
Face:                  Orbits appear          Cord Vessels:           Previously seen
normal
Lips:                  Previously seen        Kidneys:                Appear normal
Palate:                Not well visualized    Bladder:                Appears normal
Thoracic:              Previously seen        Spine:                  Previously seen
Heart:                 Appears normal         Upper Extremities:      Previously seen
(4CH, axis, and
situs)
RVOT:                  Appears normal         Lower Extremities:      Previously seen

Other:  Fetus appears to be a female. Heels and 5th digit previously
visualized.
Cervix Uterus Adnexa

Cervix
Length:            3.6  cm.
Normal appearance by transvaginal scan

Uterus
No abnormality visualized.

Left Ovary
Size(cm)     2.08  x    1.88   x  1.19      Vol(ml):
Within normal limits. No adnexal mass visualized.

Right Ovary
Size(cm)     2.48  x    1.67   x  1.35      Vol(ml):
Within normal limits. No adnexal mass visualized.

Cul De Sac:   No free fluid seen.

Adnexa:       No abnormality visualized.
Impression

SIUP at 28+3 weeks
Normal interval anatomy; anatomic survey complete
Normal amniotic fluid volume
Appropriate interval growth with EFW at the 46th %tile
Posterior low-lying placenta: inferior edge 
 1.0 cms from
internal os
Recommendations

Follow-up ultrasound at 36 weeks to reassess placental
location

## 2017-07-19 ENCOUNTER — Ambulatory Visit (INDEPENDENT_AMBULATORY_CARE_PROVIDER_SITE_OTHER): Payer: Commercial Managed Care - HMO | Admitting: *Deleted

## 2017-07-19 DIAGNOSIS — Z23 Encounter for immunization: Secondary | ICD-10-CM | POA: Diagnosis not present

## 2017-11-25 ENCOUNTER — Other Ambulatory Visit: Payer: Self-pay | Admitting: Obstetrics & Gynecology

## 2017-11-25 MED ORDER — AMOXICILLIN 875 MG PO TABS: 875.0000 mg | ORAL_TABLET | Freq: Two times a day (BID) | ORAL | 0 refills | Status: DC

## 2017-11-25 NOTE — Progress Notes (Signed)
Pt has had sius presure and drainage for 2 weeks.  Initially got better then worsened.  Pain is maxilaary and in teeth. No fever, no sob, cp, n/v/d, not pregnant.    Rx Amox 875 bid x 10 days.

## 2017-11-27 MED FILL — AMOXICILLIN 875 MG TABLET: 875 | 10 days supply | Qty: 20 | Fill #0

## 2018-01-14 IMAGING — DX DG SHOULDER 2+V*R*
3 series · 3 of 3 positions shown · non-contrast
Comparison: None in PACs

CLINICAL DATA: Two days of right shoulder pain with no known injury

EXAM:
RIGHT SHOULDER - 2+ VIEW

[shoulder grashey]
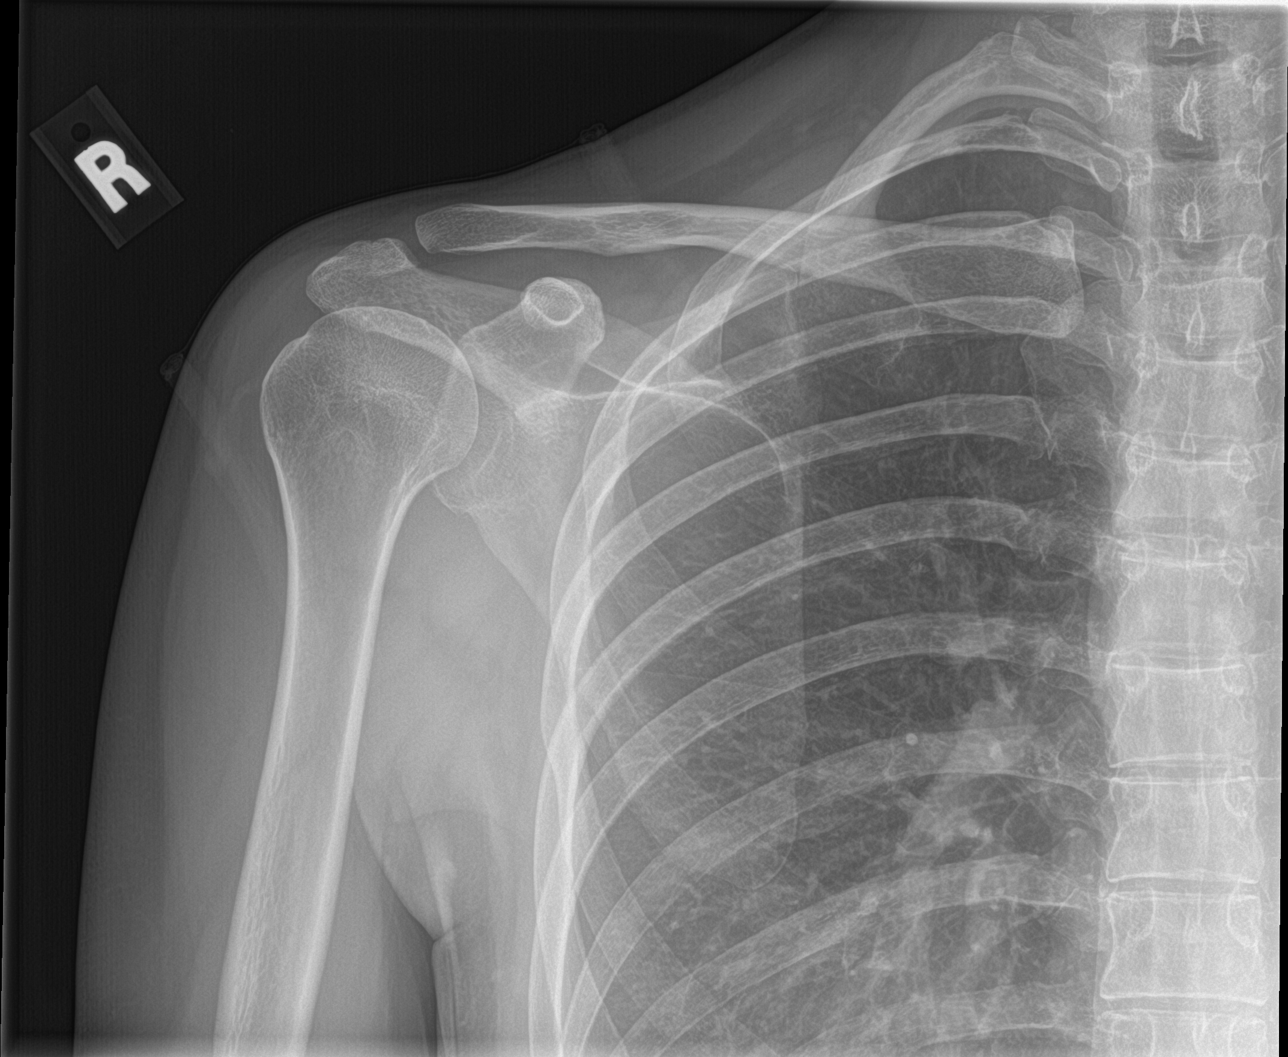

[shoulder y view]
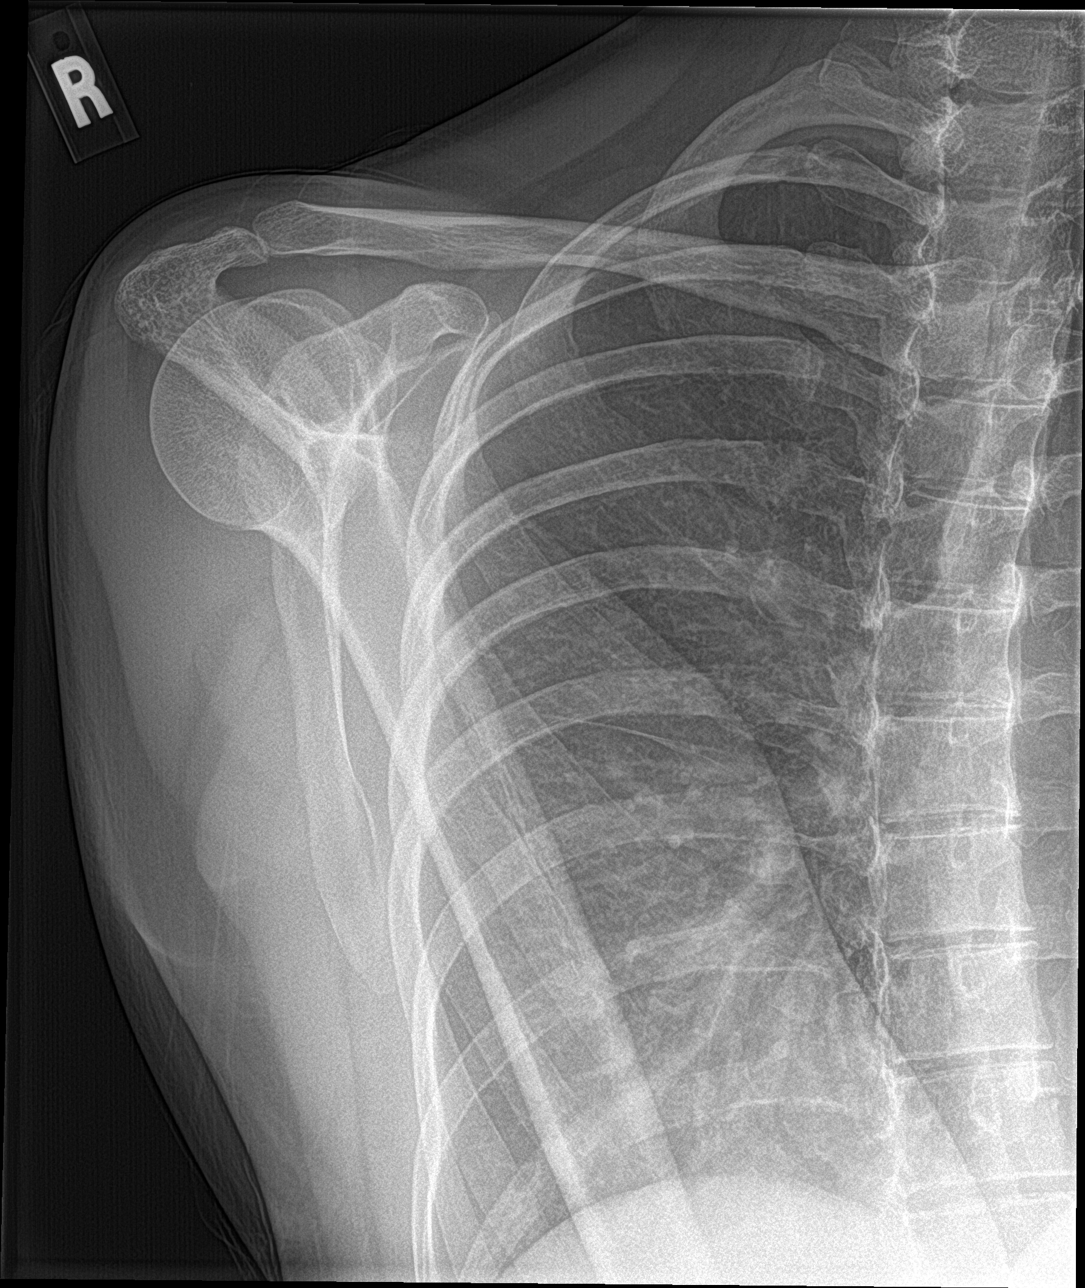

[shoulder axillary]
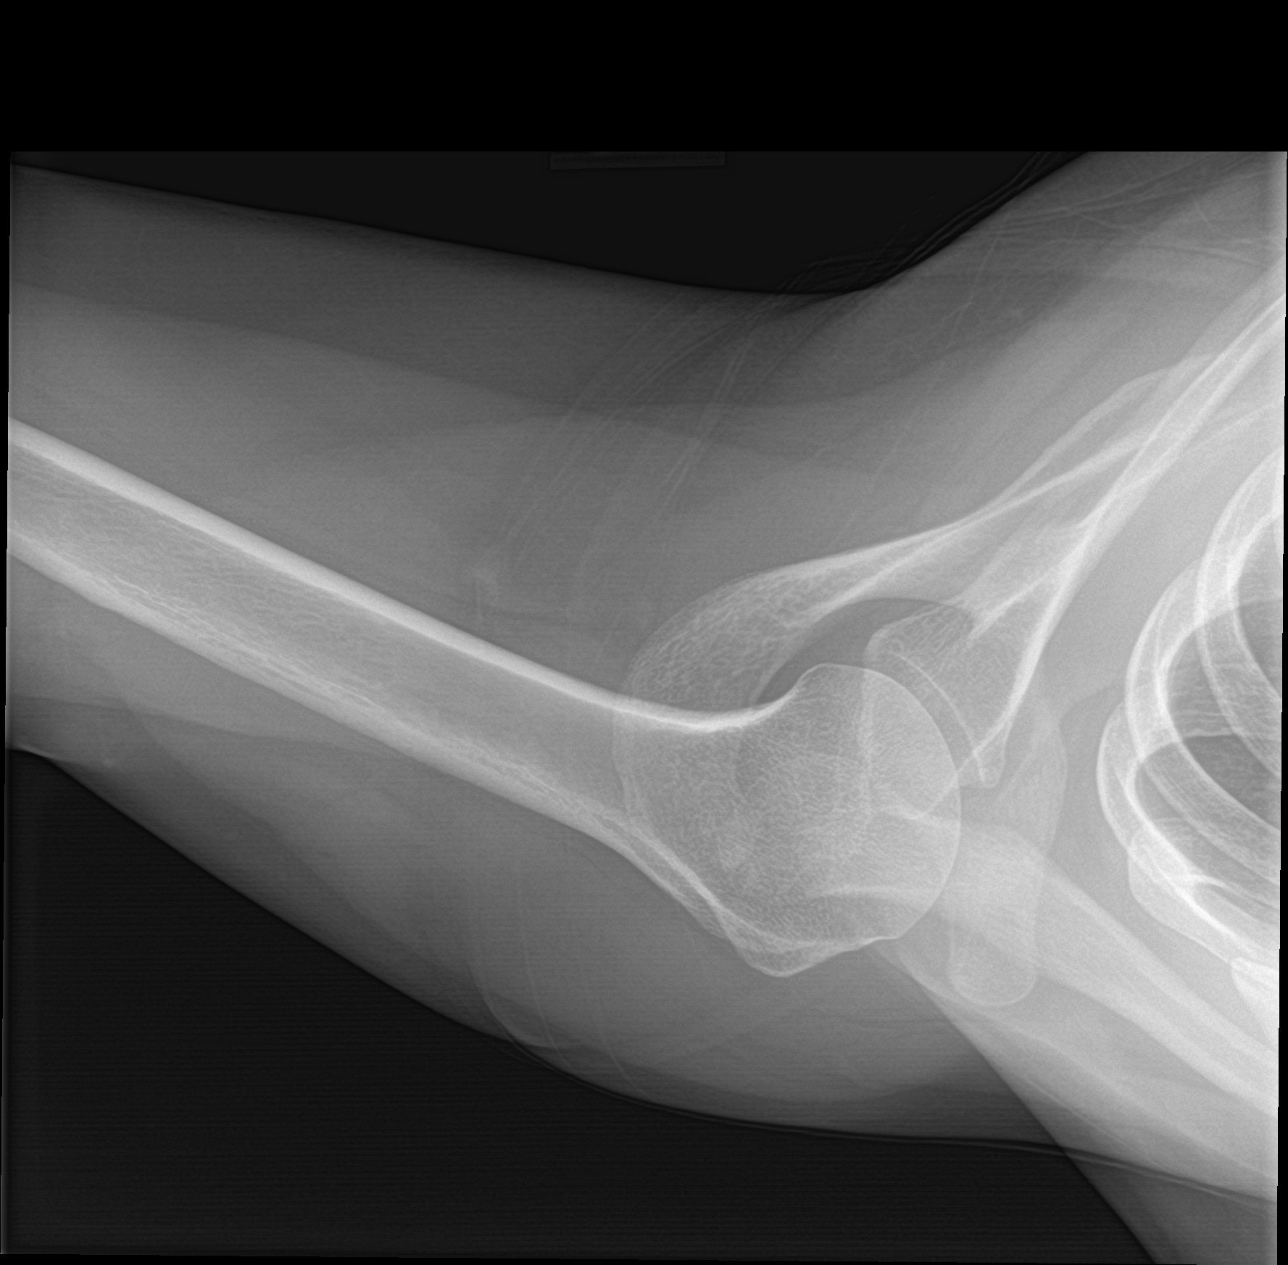

[3 of 3 positions shown; findings below may reference images not displayed]

FINDINGS: The bones of the right shoulder are subjectively adequately
mineralized. The glenohumeral and AC joint spaces are preserved. The
subacromial subdeltoid space appears normal. No acute or healing
fracture or lytic or blastic lesion is observed. The observed
portions of the low right clavicle and upper right ribs are normal.
IMPRESSION: There is no acute bony abnormality of the right shoulder.

## 2018-08-14 DIAGNOSIS — Z23 Encounter for immunization: Secondary | ICD-10-CM | POA: Diagnosis not present

## 2019-02-01 DIAGNOSIS — Z711 Person with feared health complaint in whom no diagnosis is made: Secondary | ICD-10-CM | POA: Diagnosis not present

## 2019-08-13 ENCOUNTER — Other Ambulatory Visit: Payer: Self-pay

## 2019-08-13 DIAGNOSIS — Z20822 Contact with and (suspected) exposure to covid-19: Secondary | ICD-10-CM

## 2019-08-15 LAB — NOVEL CORONAVIRUS, NAA: SARS-CoV-2, NAA: NOT DETECTED

## 2019-09-11 ENCOUNTER — Other Ambulatory Visit: Payer: Self-pay

## 2019-09-11 DIAGNOSIS — Z20822 Contact with and (suspected) exposure to covid-19: Secondary | ICD-10-CM

## 2019-09-13 LAB — NOVEL CORONAVIRUS, NAA: SARS-CoV-2, NAA: NOT DETECTED

## 2019-12-10 ENCOUNTER — Ambulatory Visit: Payer: 59 | Attending: Internal Medicine

## 2019-12-10 DIAGNOSIS — Z20822 Contact with and (suspected) exposure to covid-19: Secondary | ICD-10-CM

## 2019-12-11 LAB — NOVEL CORONAVIRUS, NAA: SARS-CoV-2, NAA: NOT DETECTED

## 2020-09-17 ENCOUNTER — Ambulatory Visit: Payer: 59 | Attending: Internal Medicine

## 2020-09-17 DIAGNOSIS — Z23 Encounter for immunization: Secondary | ICD-10-CM

## 2020-09-17 NOTE — Progress Notes (Signed)
   Covid-19 Vaccination Clinic  Name:  Jeanne Lynch    MRN: [id number] DOB: Jan 02, 1981  09/17/2020  Jeanne Lynch was observed post Covid-19 immunization for 15 minutes without incident. She was provided with Vaccine Information Sheet and instruction to access the V-Safe system.   Jeanne Lynch was instructed to call 911 with any severe reactions post vaccine: Marland Kitchen Difficulty breathing  . Swelling of face and throat  . A fast heartbeat  . A bad rash all over body  . Dizziness and weakness   Immunizations Administered    Name Date Dose VIS Date Route   Pfizer COVID-19 Vaccine 09/17/2020 11:39 AM 0.3 mL 08/24/2020 Intramuscular   Manufacturer: [employer], [employer]   Lot: [id number]   NDC: [address]-5

## 2021-03-07 ENCOUNTER — Encounter: Payer: Self-pay | Admitting: Advanced Practice Midwife

## 2021-03-07 ENCOUNTER — Other Ambulatory Visit (HOSPITAL_COMMUNITY)
Admission: RE | Admit: 2021-03-07 | Discharge: 2021-03-07 | Disposition: A | Discharge status: Home / Self Care | Payer: 59 | Source: Ambulatory Visit | Attending: Advanced Practice Midwife | Admitting: Advanced Practice Midwife

## 2021-03-07 ENCOUNTER — Other Ambulatory Visit (HOSPITAL_COMMUNITY): Payer: Self-pay

## 2021-03-07 ENCOUNTER — Ambulatory Visit (INDEPENDENT_AMBULATORY_CARE_PROVIDER_SITE_OTHER): Payer: 59 | Admitting: Advanced Practice Midwife

## 2021-03-07 ENCOUNTER — Other Ambulatory Visit: Payer: Self-pay

## 2021-03-07 VITALS — BP 125/71 | HR 89 | Resp 16 | Ht 72.0 in | Wt 161.0 lb

## 2021-03-07 DIAGNOSIS — Z01419 Encounter for gynecological examination (general) (routine) without abnormal findings: Secondary | ICD-10-CM

## 2021-03-07 DIAGNOSIS — Z124 Encounter for screening for malignant neoplasm of cervix: Secondary | ICD-10-CM | POA: Diagnosis not present

## 2021-03-07 DIAGNOSIS — A6000 Herpesviral infection of urogenital system, unspecified: Secondary | ICD-10-CM

## 2021-03-07 DIAGNOSIS — Z975 Presence of (intrauterine) contraceptive device: Secondary | ICD-10-CM | POA: Diagnosis not present

## 2021-03-07 MED ORDER — VALACYCLOVIR HCL 1 G PO TABS
1000.0000 mg | ORAL_TABLET | Freq: Every day | ORAL | 10 refills | Status: DC
  Filled 2021-03-07: qty 5, 5d supply, fill #0

## 2021-03-07 NOTE — Progress Notes (Signed)
Subjective:     Jeanne Lynch is a 40 y.o. female here at [facility] for a routine exam.  Current complaints: None. Pt with hx HSV, last outbreak 2017 but desires Rx for Valtrex for PRN use.  Personal health questionnaire reviewed: yes.  Do you have a primary care provider? yes Do you feel safe at home? yes  Flowsheet Row Office Visit from 01/02/2017 in [facility]  PHQ-2 Total Score 0      Risk factors for chronic health problems: Smoking: Never smoker Alchohol/how much: None Pt BMI: Body mass index is 21.84 kg/m.   Gynecologic History No LMP recorded. (Menstrual status: IUD). Contraception: IUD, placed 01/07/17 Last Pap: 2017. Results were: normal Last mammogram: n/a.   Obstetric History OB History  Gravida Para Term Preterm AB Living  1 1 1     1   SAB IAB Ectopic Multiple Live Births        0 1    # Outcome Date GA Lbr Len/2nd Weight Sex Delivery Anes PTL Lv  1 Term 06/25/16 [redacted]w[redacted]d 10:40 / 00:47 7 lb (3.175 kg) F Vag-Spont Pud  LIV     The following portions of the patient's history were reviewed and updated as appropriate: allergies, current medications, past family history, past medical history, past social history, past surgical history and problem list.  Review of Systems Pertinent items noted in HPI and remainder of comprehensive ROS otherwise negative.    Objective:   BP 125/71   Pulse 89   Resp 16   Ht 6' (1.829 m)   Wt 161 lb (73 kg)   Breastfeeding No   BMI 21.84 kg/m  VS reviewed, nursing note reviewed,  Constitutional: well developed, well nourished, no distress HEENT: normocephalic CV: normal rate Pulm/chest wall: normal effort Breast Exam:   performed: right breast normal without mass, skin or nipple changes or axillary nodes, left breast normal without mass, skin or nipple changes or axillary nodes Abdomen: soft Neuro: alert and oriented x 3 Skin: warm, dry Psych: affect normal Pelvic exam:  Performed: Cervix pink, visually closed, without lesion, scant white creamy discharge, vaginal walls and external genitalia normal Bimanual exam: Cervix 0/long/high, firm, anterior, neg CMT, uterus nontender, nonenlarged, adnexa without tenderness, enlargement, or mass       Assessment/Plan:   1. Well woman exam --Doing well, scant bleeding with IUD only, placed 2018 --Reviewed recommendations to begin screening mammograms, some groups recommend age 54, others age 33 so with shared decision making, pt can decide.  - Cytology - PAP( [place])  2. Cervical cancer screening --Pap today  3. Recurrent genital herpes --No s/sx of outbreak, last outbreak in 2017. Pt would like medication for PRN in case of outbreak.  Rx for recurrent therapy with refills.   - valACYclovir (VALTREX) 1000 MG tablet; Take 1 tablet (1,000 mg total) by mouth daily.  Dispense: 5 tablet; Refill: 10   Follow up in: 1 year or as needed.   2018, CNM 11:12 AM

## 2021-03-08 LAB — CYTOLOGY - PAP
Comment: NEGATIVE
Diagnosis: NEGATIVE
High risk HPV: NEGATIVE

## 2021-03-15 ENCOUNTER — Other Ambulatory Visit (HOSPITAL_COMMUNITY): Payer: Self-pay

## 2021-03-28 ENCOUNTER — Other Ambulatory Visit: Payer: Self-pay

## 2021-03-28 ENCOUNTER — Ambulatory Visit: Payer: 59 | Admitting: Medical-Surgical

## 2021-03-28 ENCOUNTER — Encounter: Payer: Self-pay | Admitting: Medical-Surgical

## 2021-03-28 VITALS — BP 112/72 | HR 80 | Temp 99.0°F | Ht 69.5 in | Wt 162.2 lb

## 2021-03-28 DIAGNOSIS — Z0000 Encounter for general adult medical examination without abnormal findings: Secondary | ICD-10-CM | POA: Diagnosis not present

## 2021-03-28 DIAGNOSIS — Z7689 Persons encountering health services in other specified circumstances: Secondary | ICD-10-CM | POA: Diagnosis not present

## 2021-03-28 DIAGNOSIS — Z1159 Encounter for screening for other viral diseases: Secondary | ICD-10-CM

## 2021-03-28 DIAGNOSIS — F419 Anxiety disorder, unspecified: Secondary | ICD-10-CM | POA: Diagnosis not present

## 2021-03-28 DIAGNOSIS — Z1329 Encounter for screening for other suspected endocrine disorder: Secondary | ICD-10-CM

## 2021-03-28 LAB — CBC WITH DIFFERENTIAL/PLATELET
Absolute Monocytes: 389 cells/uL (ref 200–950)
MCHC: 32.9 g/dL (ref 32.0–36.0)
Monocytes Relative: 6.7 %

## 2021-03-28 NOTE — Patient Instructions (Signed)
Preventive Care 21-39 Years Old, Female Preventive care refers to lifestyle choices and visits with your health care provider that can promote health and wellness. This includes:  A yearly physical exam. This is also called an annual wellness visit.  Regular dental and eye exams.  Immunizations.  Screening for certain conditions.  Healthy lifestyle choices, such as: ? Eating a healthy diet. ? Getting regular exercise. ? Not using drugs or products that contain nicotine and tobacco. ? Limiting alcohol use. What can I expect for my preventive care visit? Physical exam Your health care provider may check your:  Height and weight. These may be used to calculate your BMI (body mass index). BMI is a measurement that tells if you are at a healthy weight.  Heart rate and blood pressure.  Body temperature.  Skin for abnormal spots. Counseling Your health care provider may ask you questions about your:  Past medical problems.  Family's medical history.  Alcohol, tobacco, and drug use.  Emotional well-being.  Home life and relationship well-being.  Sexual activity.  Diet, exercise, and sleep habits.  Work and work environment.  Access to firearms.  Method of birth control.  Menstrual cycle.  Pregnancy history. What immunizations do I need? Vaccines are usually given at various ages, according to a schedule. Your health care provider will recommend vaccines for you based on your age, medical history, and lifestyle or other factors, such as travel or where you work.   What tests do I need? Blood tests  Lipid and cholesterol levels. These may be checked every 5 years starting at age 20.  Hepatitis C test.  Hepatitis B test. Screening  Diabetes screening. This is done by checking your blood sugar (glucose) after you have not eaten for a while (fasting).  STD (sexually transmitted disease) testing, if you are at risk.  BRCA-related cancer screening. This may be  done if you have a family history of breast, ovarian, tubal, or peritoneal cancers.  Pelvic exam and Pap test. This may be done every 3 years starting at age 21. Starting at age 30, this may be done every 5 years if you have a Pap test in combination with an HPV test. Talk with your health care provider about your test results, treatment options, and if necessary, the need for more tests.   Follow these instructions at home: Eating and drinking  Eat a healthy diet that includes fresh fruits and vegetables, whole grains, lean protein, and low-fat dairy products.  Take vitamin and mineral supplements as recommended by your health care provider.  Do not drink alcohol if: ? Your health care provider tells you not to drink. ? You are pregnant, may be pregnant, or are planning to become pregnant.  If you drink alcohol: ? Limit how much you have to 0-1 drink a day. ? Be aware of how much alcohol is in your drink. In the [place] one drink equals one 12 oz bottle of beer (355 mL), one 5 oz glass of wine (148 mL), or one 1 oz glass of hard liquor (44 mL).   Lifestyle  Take daily care of your teeth and gums. Brush your teeth every morning and night with fluoride toothpaste. Floss one time each day.  Stay active. Exercise for at least 30 minutes 5 or more days each week.  Do not use any products that contain nicotine or tobacco, such as cigarettes, e-cigarettes, and chewing tobacco. If you need help quitting, ask your health care provider.  Do not   use drugs.  If you are sexually active, practice safe sex. Use a condom or other form of protection to prevent STIs (sexually transmitted infections).  If you do not wish to become pregnant, use a form of birth control. If you plan to become pregnant, see your health care provider for a prepregnancy visit.  Find healthy ways to cope with stress, such as: ? Meditation, yoga, or listening to music. ? Journaling. ? Talking to a trusted  person. ? Spending time with friends and family. Safety  Always wear your seat belt while driving or riding in a vehicle.  Do not drive: ? If you have been drinking alcohol. Do not ride with someone who has been drinking. ? When you are tired or distracted. ? While texting.  Wear a helmet and other protective equipment during sports activities.  If you have firearms in your house, make sure you follow all gun safety procedures.  Seek help if you have been physically or sexually abused. What's next?  Go to your health care provider once a year for an annual wellness visit.  Ask your health care provider how often you should have your eyes and teeth checked.  Stay up to date on all vaccines. This information is not intended to replace advice given to you by your health care provider. Make sure you discuss any questions you have with your health care provider. Document Revised: 06/19/2020 Document Reviewed: 07/03/2018 Elsevier Patient Education  2021 Elsevier Inc.  

## 2021-03-28 NOTE — Progress Notes (Signed)
New Patient Office Visit  Subjective:  Patient ID: Jeanne Lynch, female    DOB: 1980-12-29  Age: 40 y.o. MRN: [id number]  CC:  Chief Complaint  Patient presents with  . Establish Care    HPI Jeanne Lynch presents to establish care.  She is concerned regarding her mental health.  Feels that she has a lot of anxiety with intrusive thoughts but this has always been a normal thing for her.  After discussing with other people, now she wonders if it is normal to have these thoughts and feelings or if there are better ways of coping that she is not aware of.  She is not interested in starting medications but would like to be referred to a counselor.  Past Medical History:  Diagnosis Date  . Migraines     Past Surgical History:  Procedure Laterality Date  . WISDOM TOOTH EXTRACTION      Family History  Problem Relation Age of Onset  . Hypertension Mother   . Hypertension Father   . Renal cancer Father   . Prostate cancer Maternal Grandfather   . Prostate cancer Paternal Grandfather   . Diabetes Other   . Skin cancer Other     Social History   Socioeconomic History  . Marital status: Married    Spouse name: Not on file  . Number of children: Not on file  . Years of education: Not on file  . Highest education level: Not on file  Occupational History  . Occupation: Nanny  Tobacco Use  . Smoking status: Former Smoker    Years: 4.00    Types: Cigarettes    Quit date: 2012    Years since quitting: 10.4  . Smokeless tobacco: Never Used  Vaping Use  . Vaping Use: Never used  Substance and Sexual Activity  . Alcohol use: Yes    Alcohol/week: 14.0 standard drinks    Types: 14 Standard drinks or equivalent per week    Comment: 2 drinks/day  . Drug use: Yes    Types: Marijuana    Comment: average use::once every other month  . Sexual activity: Yes    Partners: Male    Birth control/protection: I.U.D.  Other Topics Concern  . Not on file  Social History Narrative   . Not on file   Social Determinants of Health   Financial Resource Strain: Not on file  Food Insecurity: Not on file  Transportation Needs: Not on file  Physical Activity: Not on file  Stress: Not on file  Social Connections: Not on file  Intimate Partner Violence: Not on file    ROS Review of Systems  Constitutional: Negative for chills, fatigue, fever and unexpected weight change.  Respiratory: Negative for cough, chest tightness, shortness of breath and wheezing.   Cardiovascular: Negative for chest pain, palpitations and leg swelling.  Gastrointestinal: Negative for abdominal pain, diarrhea, nausea and vomiting.  Genitourinary: Negative for dysuria, frequency and urgency.  Neurological: Negative for dizziness, light-headedness and headaches.  Psychiatric/Behavioral: Negative for dysphoric mood, self-injury and suicidal ideas. The patient is nervous/anxious.     Objective:   Today's Vitals: BP 112/72   Pulse 80   Temp 99 F (37.2 C)   Ht 5' 9.5" (1.765 m)   Wt 162 lb 3.2 oz (73.6 kg)   SpO2 99%   BMI 23.61 kg/m   Physical Exam Vitals reviewed.  Constitutional:      General: She is not in acute distress.    Appearance: Normal appearance.  HENT:  Head: Normocephalic and atraumatic.  Cardiovascular:     Rate and Rhythm: Normal rate and regular rhythm.     Pulses: Normal pulses.     Heart sounds: Normal heart sounds. No murmur heard. No friction rub. No gallop.   Pulmonary:     Effort: Pulmonary effort is normal. No respiratory distress.     Breath sounds: Normal breath sounds. No wheezing.  Abdominal:     General: Abdomen is flat. Bowel sounds are normal. There is no distension.     Palpations: Abdomen is soft.     Tenderness: There is no abdominal tenderness. There is no guarding or rebound.     Hernia: No hernia is present.  Skin:    General: Skin is warm and dry.  Neurological:     Mental Status: She is alert and oriented to person, place, and time.   Psychiatric:        Mood and Affect: Mood normal.        Behavior: Behavior normal.        Thought Content: Thought content normal.        Judgment: Judgment normal.     Assessment & Plan:   1. Encounter to establish care Reviewed available information and discussed healthcare concerns with patient.  2. Annual physical exam Checking CBC with differential, CMP, and lipid panel today. - CBC with Differential/Platelet - COMPLETE METABOLIC PANEL WITH GFR - Lipid panel  3. Anxiety Referring to behavioral health for counseling. - Ambulatory referral to Behavioral Health  4. Need for hepatitis C screening test Discussed screening recommendations.  She is agreeable so we will add this blood work today. - Hepatitis C antibody  5. Screening for endocrine disorder Checking TSH. - TSH    Outpatient Encounter Medications as of 03/28/2021  Medication Sig  . acetaminophen (TYLENOL) 500 MG tablet Take 500 mg by mouth every 6 (six) hours as needed.  Marland Kitchen ibuprofen (ADVIL) 200 MG tablet Take 200 mg by mouth every 6 (six) hours as needed.  Marland Kitchen levonorgestrel (MIRENA) 20 MCG/DAY IUD 1 each by Intrauterine route once.  . Multiple Vitamin (MULTI-VITAMIN) tablet Take 1 tablet by mouth daily.  . Turmeric 500 MG CAPS Take 1 capsule by mouth daily.  . valACYclovir (VALTREX) 1000 MG tablet Take 1 tablet (1,000 mg total) by mouth daily.   No facility-administered encounter medications on file as of 03/28/2021.    Follow-up: Return in about 1 year (around 03/28/2022) for annual physical exam.   Thayer Ohm, DNP, APRN, FNP-BC [facility]

## 2021-03-29 LAB — LIPID PANEL
Cholesterol: 225 mg/dL — ABNORMAL HIGH (ref <200)
HDL: 102 mg/dL (ref >=50)
LDL Cholesterol (Calc): 109 mg/dL (calc) — ABNORMAL HIGH
Non-HDL Cholesterol (Calc): 123 mg/dL (calc) (ref <130)
Total CHOL/HDL Ratio: 2.2 (calc) (ref <5.0)
Triglycerides: 49 mg/dL (ref <150)

## 2021-03-29 LAB — COMPLETE METABOLIC PANEL WITH GFR
AG Ratio: 1.9 (calc) (ref 1.0–2.5)
ALT: 16 U/L (ref 6–29)
AST: 17 U/L (ref 10–30)
Albumin: 4.6 g/dL (ref 3.6–5.1)
Alkaline phosphatase (APISO): 39 U/L (ref 31–125)
BUN: 12 mg/dL (ref 7–25)
CO2: 29 mmol/L (ref 20–32)
Calcium: 9.8 mg/dL (ref 8.6–10.2)
Chloride: 102 mmol/L (ref 98–110)
Creat: 0.59 mg/dL (ref 0.50–1.10)
GFR, Est African American: 134 mL/min/{1.73_m2} (ref >=60)
GFR, Est Non African American: 115 mL/min/{1.73_m2} (ref >=60)
Globulin: 2.4 g/dL (calc) (ref 1.9–3.7)
Glucose, Bld: 94 mg/dL (ref 65–139)
Potassium: 4.8 mmol/L (ref 3.5–5.3)
Sodium: 137 mmol/L (ref 135–146)
Total Bilirubin: 0.6 mg/dL (ref 0.2–1.2)
Total Protein: 7 g/dL (ref 6.1–8.1)

## 2021-03-29 LAB — CBC WITH DIFFERENTIAL/PLATELET
Basophils Absolute: 41 cells/uL (ref 0–200)
Basophils Relative: 0.7 %
Eosinophils Absolute: 52 cells/uL (ref 15–500)
Eosinophils Relative: 0.9 %
HCT: 41.9 % (ref 35.0–45.0)
Hemoglobin: 13.8 g/dL (ref 11.7–15.5)
Lymphs Abs: 1647 cells/uL (ref 850–3900)
MCH: 30.1 pg (ref 27.0–33.0)
MCV: 91.5 fL (ref 80.0–100.0)
MPV: 10.1 fL (ref 7.5–12.5)
Neutro Abs: 3671 cells/uL (ref 1500–7800)
Neutrophils Relative %: 63.3 %
Platelets: 277 10*3/uL (ref 140–400)
RBC: 4.58 10*6/uL (ref 3.80–5.10)
RDW: 11.8 % (ref 11.0–15.0)
Total Lymphocyte: 28.4 %
WBC: 5.8 10*3/uL (ref 3.8–10.8)

## 2021-03-29 LAB — HEPATITIS C ANTIBODY
Hepatitis C Ab: NONREACTIVE
SIGNAL TO CUT-OFF: 0.01 (ref <1.00)

## 2021-03-29 LAB — TSH: TSH: 1.45 mIU/L

## 2021-05-23 ENCOUNTER — Telehealth: Payer: 59 | Admitting: Physician Assistant

## 2021-05-23 ENCOUNTER — Encounter: Payer: Self-pay | Admitting: Physician Assistant

## 2021-05-23 DIAGNOSIS — U071 COVID-19: Secondary | ICD-10-CM | POA: Diagnosis not present

## 2021-05-23 DIAGNOSIS — J45909 Unspecified asthma, uncomplicated: Secondary | ICD-10-CM | POA: Insufficient documentation

## 2021-05-23 DIAGNOSIS — Q676 Pectus excavatum: Secondary | ICD-10-CM | POA: Insufficient documentation

## 2021-05-23 MED ORDER — QVAR REDIHALER 40 MCG/ACT IN AERB: 1.0000 | INHALATION_SPRAY | Freq: Two times a day (BID) | RESPIRATORY_TRACT | 0 refills | Status: AC

## 2021-05-23 MED ORDER — NIRMATRELVIR/RITONAVIR (PAXLOVID)TABLET: 3.0000 | ORAL_TABLET | Freq: Two times a day (BID) | ORAL | 0 refills | Status: AC

## 2021-05-23 NOTE — Progress Notes (Signed)
Virtual Visit Consent   Jeanne Lynch, you are scheduled for a virtual visit with a [facility] provider today.     Just as with appointments in the office, your consent must be obtained to participate.  Your consent will be active for this visit and any virtual visit you may have with one of our providers in the next 365 days.     If you have a MyChart account, a copy of this consent can be sent to you electronically.  All virtual visits are billed to your insurance company just like a traditional visit in the office.    As this is a virtual visit, video technology does not allow for your provider to perform a traditional examination.  This may limit your provider's ability to fully assess your condition.  If your provider identifies any concerns that need to be evaluated in person or the need to arrange testing (such as labs, EKG, etc.), we will make arrangements to do so.     Although advances in technology are sophisticated, we cannot ensure that it will always work on either your end or our end.  If the connection with a video visit is poor, the visit may have to be switched to a telephone visit.  With either a video or telephone visit, we are not always able to ensure that we have a secure connection.     I need to obtain your verbal consent now.   Are you willing to proceed with your visit today?    Jeanne Lynch has provided verbal consent on 05/23/2021 for a virtual visit (video or telephone).   Piedad Climes, [place]   Date: 05/23/2021 7:32 AM   Virtual Visit via Video Note   I, Piedad Climes, connected with  Jeanne Lynch  ([id number], 12/16/1980) on 05/23/21 at  7:30 AM EDT by a video-enabled telemedicine application and verified that I am speaking with the correct person using two identifiers.  Location: Patient: Virtual Visit Location Patient: Home Provider: Virtual Visit Location Provider: Home Office   I discussed the limitations of evaluation and  management by telemedicine and the availability of in person appointments. The patient expressed understanding and agreed to proceed.    History of Present Illness: Jeanne Lynch is a 40 y.o. who identifies as a female who was assigned female at birth, and is being seen today for COVID-19. Endorses sore throat, achy, chills, diarrhea starting yesterday. Unsure of fever but has been taking Tylenol and Ibuprofen. Has noted some chest tightness but no overt SOB.  Notes history of pectus excavatum with low lung volumes and history of reactive airway. Former smoker. Has noted significant fatigue.   Had 2 home COVID tests that were positive.  Is unsure of where exposure might have came from.  HPI: HPI  Problems:  Patient Active Problem List   Diagnosis Date Noted   Pectus excavatum 05/23/2021   Reactive airway disease 05/23/2021   IUD (intrauterine device) in place 02/04/2017   Dyspnea and respiratory abnormalities 01/02/2017   Herpes genitalis 01/02/2017   Seborrheic keratoses 01/02/2017   Multiple acquired skin tags 01/02/2017   Lactating mother 01/02/2017   Rotator cuff syndrome, right 01/01/2017   Irregular mole 04/27/2016    Allergies:  Allergies  Allergen Reactions   Other Hives, Itching, Swelling and Rash    Mosquito   Sudafed [Pseudoephedrine Hcl] Rash   Wasp Venom Hives, Itching, Swelling and Rash   Medications:  Current Outpatient Medications:    beclomethasone (QVAR  REDIHALER) 40 MCG/ACT inhaler, Inhale 1 puff into the lungs 2 (two) times daily., Disp: 1 each, Rfl: 0   nirmatrelvir/ritonavir EUA (PAXLOVID) TABS, Take 3 tablets by mouth 2 (two) times daily for 5 days. (Take nirmatrelvir 150 mg two tablets twice daily for 5 days and ritonavir 100 mg one tablet twice daily for 5 days)Patient GFR is 115, Disp: 30 tablet, Rfl: 0   levonorgestrel (MIRENA) 20 MCG/DAY IUD, 1 each by Intrauterine route once., Disp: , Rfl:    Multiple Vitamin (MULTI-VITAMIN) tablet, Take 1 tablet by  mouth daily., Disp: , Rfl:    Turmeric 500 MG CAPS, Take 1 capsule by mouth daily., Disp: , Rfl:   Observations/Objective: Patient is well-developed, well-nourished in no acute distress.  Resting comfortably at home.  Head is normocephalic, atraumatic.  No labored breathing. Speech is clear and coherent with logical content.  Patient is alert and oriented at baseline.   Assessment and Plan: 1. COVID-19 - nirmatrelvir/ritonavir EUA (PAXLOVID) TABS; Take 3 tablets by mouth 2 (two) times daily for 5 days. (Take nirmatrelvir 150 mg two tablets twice daily for 5 days and ritonavir 100 mg one tablet twice daily for 5 days)Patient GFR is 115  Dispense: 30 tablet; Refill: 0 With history of lower lung volume and significant reactive airway with URI. Mild-moderate symptoms at present. Discussed pros/cons of antivirals including potential ADRs. She is adamant about starting medication and requests Paxlovid. Recent GFR on file at 115. No other chronic medications with exception of placed IUD. Rx Paxlovid sent to pharmacy. Supportive measures, OTC medications and vitamin regimen reviewed. Rx Qvar in case of wheezing. Has allergy to sympathomimetics so avoiding albuterol if possible. Patient has been enrolled in a COVID monitoring program through MyChart. Quarantine reviewed in detail. ER precautions reviewed with patient.   Follow Up Instructions: I discussed the assessment and treatment plan with the patient. The patient was provided an opportunity to ask questions and all were answered. The patient agreed with the plan and demonstrated an understanding of the instructions.  A copy of instructions were sent to the patient via MyChart.  The patient was advised to call back or seek an in-person evaluation if the symptoms worsen or if the condition fails to improve as anticipated.  Time:  I spent 15 minutes with the patient via telehealth technology discussing the above problems/concerns.    Piedad Climes, PA-C

## 2021-05-23 NOTE — Patient Instructions (Signed)
  Marlow Baars, thank you for joining Piedad Climes, PA-C for today's virtual visit.  While this provider is not your primary care provider (PCP), if your PCP is located in our provider database this encounter information will be shared with them immediately following your visit.  Consent: (Patient) Oliviagrace Crisanti provided verbal consent for this virtual visit at the beginning of the encounter.  Current Medications:  Current Outpatient Medications:    acetaminophen (TYLENOL) 500 MG tablet, Take 500 mg by mouth every 6 (six) hours as needed., Disp: , Rfl:    ibuprofen (ADVIL) 200 MG tablet, Take 200 mg by mouth every 6 (six) hours as needed., Disp: , Rfl:    levonorgestrel (MIRENA) 20 MCG/DAY IUD, 1 each by Intrauterine route once., Disp: , Rfl:    Multiple Vitamin (MULTI-VITAMIN) tablet, Take 1 tablet by mouth daily., Disp: , Rfl:    Turmeric 500 MG CAPS, Take 1 capsule by mouth daily., Disp: , Rfl:    Medications ordered in this encounter:  No orders of the defined types were placed in this encounter.    *If you need refills on other medications prior to your next appointment, please contact your pharmacy*  Follow-Up: Call back or seek an in-person evaluation if the symptoms worsen or if the condition fails to improve as anticipated.  Other Instructions Please keep well-hydrated and get plenty of rest. Start a saline nasal rinse to flush out your nasal passages. You can use plain Mucinex to help thin congestion. If you have a humidifier, running in the bedroom at night. I want you to start OTC vitamin D3 1000 units daily, vitamin C 1000 mg daily, and a zinc supplement. Please take prescribed medications as directed.  You have been enrolled in a MyChart symptom monitoring program. Please answer these questions daily so we can keep track of how you are doing.  You were to quarantine for 5 days from onset of your symptoms.  After day 5, if you have had no fever and you are  feeling better, you can end quarantine but need to mask for an additional 5 days. After day 5 if you have a fever or are having significant symptoms, please quarantine for full 10 days.  If you note any worsening of symptoms, any significant shortness of breath or any chest pain, please seek ER evaluation ASAP.  Please do not delay care!    If you have been instructed to have an in-person evaluation today at a local Urgent Care facility, please use the link below. It will take you to a list of all of our available [facility], including address, phone number and hours of operation. Please do not delay care.  [facility]  If you or a family member do not have a primary care provider, use the link below to schedule a visit and establish care. When you choose a [facility] primary care physician or advanced practice provider, you gain a long-term partner in health. Find a Primary Care Provider  Learn more about [facility]'s in-office and virtual care options: [facility] - Get Care Now

## 2021-11-25 ENCOUNTER — Telehealth: Payer: BC Managed Care – PPO | Admitting: Nurse Practitioner

## 2021-11-25 DIAGNOSIS — J0140 Acute pansinusitis, unspecified: Secondary | ICD-10-CM

## 2021-11-25 MED ORDER — AMOXICILLIN-POT CLAVULANATE 875-125 MG PO TABS: 1.0000 | ORAL_TABLET | Freq: Two times a day (BID) | ORAL | 0 refills | Status: AC

## 2021-11-25 NOTE — Progress Notes (Signed)
Virtual Visit Consent   Jeanne Lynch, you are scheduled for a virtual visit with a [facility] provider today.     Just as with appointments in the office, your consent must be obtained to participate.  Your consent will be active for this visit and any virtual visit you may have with one of our providers in the next 365 days.     If you have a MyChart account, a copy of this consent can be sent to you electronically.  All virtual visits are billed to your insurance company just like a traditional visit in the office.    As this is a virtual visit, video technology does not allow for your provider to perform a traditional examination.  This may limit your provider's ability to fully assess your condition.  If your provider identifies any concerns that need to be evaluated in person or the need to arrange testing (such as labs, EKG, etc.), we will make arrangements to do so.     Although advances in technology are sophisticated, we cannot ensure that it will always work on either your end or our end.  If the connection with a video visit is poor, the visit may have to be switched to a telephone visit.  With either a video or telephone visit, we are not always able to ensure that we have a secure connection.     I need to obtain your verbal consent now.   Are you willing to proceed with your visit today?    Jeanne Lynch has provided verbal consent on 11/25/2021 for a virtual visit (video or telephone).   Jeanne Simas, FNP   Date: 11/25/2021 9:23 AM   Virtual Visit via Video Note   I, Jeanne Lynch, connected with  Jeanne Lynch  ([id number], Mar 02, 1981) on 11/25/21 at  9:45 AM EST by a video-enabled telemedicine application and verified that I am speaking with the correct person using two identifiers.  Location: Patient: Virtual Visit Location Patient: Home Provider: Virtual Visit Location Provider: Home Office   I discussed the limitations of evaluation and management by  telemedicine and the availability of in person appointments. The patient expressed understanding and agreed to proceed.    History of Present Illness: Jeanne Lynch is a 41 y.o. who identifies as a female who was assigned female at birth, and is being seen today for ongoing nasal drainage that has worsened over the past 48 hours. She has pain behind her ears in her face and now has ear fullness and pain.   She has been using ibuprofen and nasal spray for relief in addition to a multi vitamin for immune support.   She denies a fever.   She has taken a home COVID test that was negative.    Problems:  Patient Active Problem List   Diagnosis Date Noted   Pectus excavatum 05/23/2021   Reactive airway disease 05/23/2021   IUD (intrauterine device) in place 02/04/2017   Dyspnea and respiratory abnormalities 01/02/2017   Herpes genitalis 01/02/2017   Seborrheic keratoses 01/02/2017   Multiple acquired skin tags 01/02/2017   Lactating mother 01/02/2017   Rotator cuff syndrome, right 01/01/2017   Irregular mole 04/27/2016    Allergies:  Allergies  Allergen Reactions   Other Hives, Itching, Swelling and Rash    Mosquito   Sudafed [Pseudoephedrine Hcl] Rash   Wasp Venom Hives, Itching, Swelling and Rash   Medications:  Current Outpatient Medications:    beclomethasone (QVAR REDIHALER) 40 MCG/ACT inhaler, Inhale 1  puff into the lungs 2 (two) times daily., Disp: 1 each, Rfl: 0   levonorgestrel (MIRENA) 20 MCG/DAY IUD, 1 each by Intrauterine route once., Disp: , Rfl:    Multiple Vitamin (MULTI-VITAMIN) tablet, Take 1 tablet by mouth daily., Disp: , Rfl:    Turmeric 500 MG CAPS, Take 1 capsule by mouth daily., Disp: , Rfl:   Observations/Objective: Patient is well-developed, well-nourished in no acute distress.  Resting comfortably at home.  Head is normocephalic, atraumatic.  No labored breathing.  Speech is clear and coherent with logical content.  Patient is alert and oriented at  baseline.   Assessment and Plan: 1. Acute non-recurrent pansinusitis Take antibiotic with food   - amoxicillin-clavulanate (AUGMENTIN) 875-125 MG tablet; Take 1 tablet by mouth 2 (two) times daily for 7 days. Take with food  Dispense: 14 tablet; Refill: 0    Use Mucinex OTC for assistance in congestion Push fluids Rest Saline nasal spray or Flonase  Stop Afrin   Follow Up Instructions: I discussed the assessment and treatment plan with the patient. The patient was provided an opportunity to ask questions and all were answered. The patient agreed with the plan and demonstrated an understanding of the instructions.  A copy of instructions were sent to the patient via MyChart unless otherwise noted below.    The patient was advised to call back or seek an in-person evaluation if the symptoms worsen or if the condition fails to improve as anticipated.  Time:  I spent 15 minutes with the patient via telehealth technology discussing the above problems/concerns.    Jeanne Simas, FNP

## 2022-04-02 NOTE — Progress Notes (Unsigned)
   Complete physical exam  Patient: Jeanne Lynch   DOB: 11-05-1981   40 y.o. Female  MRN: [id number]  Subjective:    No chief complaint on file.   Habiba Seehafer is a 41 y.o. female who presents today for a complete physical exam. She reports consuming a {diet types:17450} diet. {types:19826} She generally feels {DESC; WELL/FAIRLY WELL/POORLY:18703}. She reports sleeping {DESC; WELL/FAIRLY WELL/POORLY:18703}. She {does/does not:200015} have additional problems to discuss today.    Most recent fall risk assessment:    03/28/2021    9:58 AM  Fall Risk   Falls in the past year? 0  Number falls in past yr: 0  Injury with Fall? 0  Follow up Falls evaluation completed     Most recent depression screenings:    03/28/2021    9:58 AM 01/02/2017    3:02 PM  PHQ 2/9 Scores  PHQ - 2 Score 0 0  PHQ- 9 Score 4     {VISON DENTAL STD PSA (Optional):27386}  {History (Optional):23778}  Patient Care Team: Christen Butter, NP as PCP - General (Nurse Practitioner)   Outpatient Medications Prior to Visit  Medication Sig   beclomethasone (QVAR REDIHALER) 40 MCG/ACT inhaler Inhale 1 puff into the lungs 2 (two) times daily.   levonorgestrel (MIRENA) 20 MCG/DAY IUD 1 each by Intrauterine route once.   Multiple Vitamin (MULTI-VITAMIN) tablet Take 1 tablet by mouth daily.   Turmeric 500 MG CAPS Take 1 capsule by mouth daily.   No facility-administered medications prior to visit.    ROS        Objective:     There were no vitals taken for this visit. {Vitals History (Optional):23777}  Physical Exam   No results found for any visits on 04/03/22. {Show previous labs (optional):23779}    Assessment & Plan:    Routine Health Maintenance and Physical Exam  Immunization History  Administered Date(s) Administered   Hepatitis A 03/13/2005, 07/15/2005   Hepatitis B 03/13/2005, 07/15/2005, 03/15/2006   Influenza,inj,Quad PF,6+ Mos 09/20/2014, 12/09/2015, 01/01/2017, 07/19/2017    Influenza-Unspecified 08/03/2019   MMR 07/20/2009   PFIZER(Purple Top)SARS-COV-2 Vaccination 11/30/2019, 12/21/2019, 09/17/2020   Tdap 07/15/2009, 03/30/2016    Health Maintenance  Topic Date Due   COVID-19 Vaccine (4 - Booster for Pfizer series) 11/12/2020   INFLUENZA VACCINE  06/05/2022   PAP SMEAR-Modifier  03/07/2024   TETANUS/TDAP  03/30/2026   Hepatitis C Screening  Completed   HIV Screening  Completed   HPV VACCINES  Aged Out    Discussed health benefits of physical activity, and encouraged her to engage in regular exercise appropriate for her age and condition.  Problem List Items Addressed This Visit   None Visit Diagnoses     Annual physical exam    -  Primary      No follow-ups on file.     Christen Butter, NP

## 2022-04-03 ENCOUNTER — Ambulatory Visit (INDEPENDENT_AMBULATORY_CARE_PROVIDER_SITE_OTHER): Payer: Self-pay | Admitting: Medical-Surgical

## 2022-04-03 DIAGNOSIS — Z0000 Encounter for general adult medical examination without abnormal findings: Secondary | ICD-10-CM

## 2022-04-03 DIAGNOSIS — Z91199 Patient's noncompliance with other medical treatment and regimen due to unspecified reason: Secondary | ICD-10-CM

## 2023-11-09 DIAGNOSIS — R0789 Other chest pain: Secondary | ICD-10-CM | POA: Diagnosis not present

## 2023-11-09 DIAGNOSIS — R051 Acute cough: Secondary | ICD-10-CM | POA: Diagnosis not present

## 2023-11-09 DIAGNOSIS — M940 Chondrocostal junction syndrome [Tietze]: Secondary | ICD-10-CM | POA: Diagnosis not present

## 2024-10-25 ENCOUNTER — Other Ambulatory Visit: Payer: Self-pay | Admitting: Obstetrics & Gynecology

## 2024-10-25 MED ORDER — OSELTAMIVIR PHOSPHATE 75 MG PO CAPS: ORAL_CAPSULE | ORAL | 1 refills | Status: AC
